# Patient Record
Sex: Male | Born: 1990 | Race: White | Hispanic: No | Marital: Married | Smoking: Former smoker
Health system: Southern US, Community
[De-identification: ages and names within clinical notes are randomized; demographics above are authoritative.]

## PROBLEM LIST (undated history)

## (undated) DIAGNOSIS — IMO0001 Reserved for inherently not codable concepts without codable children: Secondary | ICD-10-CM

## (undated) DIAGNOSIS — F909 Attention-deficit hyperactivity disorder, unspecified type: Secondary | ICD-10-CM

## (undated) DIAGNOSIS — U071 COVID-19: Secondary | ICD-10-CM

## (undated) DIAGNOSIS — H7290 Unspecified perforation of tympanic membrane, unspecified ear: Secondary | ICD-10-CM

## (undated) DIAGNOSIS — K219 Gastro-esophageal reflux disease without esophagitis: Secondary | ICD-10-CM

## (undated) DIAGNOSIS — B356 Tinea cruris: Secondary | ICD-10-CM

## (undated) DIAGNOSIS — F411 Generalized anxiety disorder: Secondary | ICD-10-CM

## (undated) DIAGNOSIS — G4700 Insomnia, unspecified: Secondary | ICD-10-CM

## (undated) DIAGNOSIS — R197 Diarrhea, unspecified: Secondary | ICD-10-CM

## (undated) HISTORY — DX: COVID-19: U07.1

## (undated) HISTORY — DX: Generalized anxiety disorder: F41.1

## (undated) HISTORY — DX: Gastro-esophageal reflux disease without esophagitis: K21.9

## (undated) HISTORY — PX: WISDOM TOOTH EXTRACTION: SHX21

## (undated) HISTORY — DX: Attention-deficit hyperactivity disorder, unspecified type: F90.9

## (undated) HISTORY — DX: Tinea cruris: B35.6

## (undated) HISTORY — PX: MYRINGOPLASTY: SUR873

## (undated) HISTORY — DX: Insomnia, unspecified: G47.00

## (undated) HISTORY — DX: Diarrhea, unspecified: R19.7

## (undated) HISTORY — PX: TONSILLECTOMY: SUR1361

---

## 2011-10-26 DIAGNOSIS — H7290 Unspecified perforation of tympanic membrane, unspecified ear: Secondary | ICD-10-CM

## 2011-10-26 HISTORY — DX: Unspecified perforation of tympanic membrane, unspecified ear: H72.90

## 2011-11-07 ENCOUNTER — Encounter (HOSPITAL_BASED_OUTPATIENT_CLINIC_OR_DEPARTMENT_OTHER): Payer: Self-pay | Admitting: *Deleted

## 2011-11-07 DIAGNOSIS — IMO0001 Reserved for inherently not codable concepts without codable children: Secondary | ICD-10-CM

## 2011-11-07 HISTORY — DX: Reserved for inherently not codable concepts without codable children: IMO0001

## 2011-11-10 NOTE — H&P (Signed)
PREOPERATIVE H&P  Chief Complaint: decreased hearing  HPI: Louis Vang is a 21 y.o. male who presents for evaluation of hearing and ear complaints. 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 He has had previous right tympanoplasty. On exam he has a large L TM perforation of about 25% and a small R TM perforation. He's taken to the OR for repair of the perfs.   Past Medical History  Diagnosis Date  . 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 Healing laceration 11/07/2011    web space right hand   Past Surgical History  Procedure Date  . Myringoplasty age 14  . Tonsillectomy   . Wisdom tooth extraction    History   Social History  . Marital Status: Unknown    Spouse Name: N/A    Number of Children: N/A  . Years of Education: N/A   Social History Main Topics  . Smoking status: Current Some Day Smoker -- 2 years    Types: Cigarettes  . Smokeless tobacco: Never Used   Comment: 5 cig./week  . Alcohol Use: Yes     2-3 x/week  . Drug Use: No  . Sexually Active:    Other Topics Concern  . None   Social History Narrative  . None   History reviewed. No pertinent family history. No Known Allergies Prior to Admission medications   Medication Sig Start Date End Date Taking? Authorizing Provider  fish oil-omega-3 fatty acids 1000 MG capsule Take 2 g by mouth daily.   Yes Historical Provider, MD  Multiple Vitamin (MULTIVITAMIN) tablet Take 1 tablet by mouth daily.   Yes Historical Provider, MD     Positive ROS: decreased hearing  All other systems have been reviewed and were otherwise negative with the exception of those mentioned in the HPI and as above.  Physical Exam: There were no vitals filed for this visit.  General: Alert, no acute distress Oral: Normal oral mucosa and tonsils Nasal: Clear nasal passages Neck: No palpable adenopathy  or thyroid nodules Ear: Ear canal is clear with 25 % anterior central L TM perforation and small ant superior R TM perforation Cardiovascular: Regular rate and rhythm, no murmur.  Respiratory: Clear to auscultation Neurologic: Alert and oriented x 3   Assessment/Plan: TM perforations Plan for Procedure(s): TYMPANOPLASTY left MYRINGOPLASTY WITH FAT GRAFT right   Dillard Cannon, MD 11/10/2011 10:21 PM

## 2011-11-11 ENCOUNTER — Encounter (HOSPITAL_BASED_OUTPATIENT_CLINIC_OR_DEPARTMENT_OTHER)
Admission: RE | Disposition: A | Discharge status: Home / Self Care | Payer: Self-pay | Source: Ambulatory Visit | Attending: Otolaryngology

## 2011-11-11 ENCOUNTER — Encounter (HOSPITAL_BASED_OUTPATIENT_CLINIC_OR_DEPARTMENT_OTHER): Payer: Self-pay | Admitting: Anesthesiology

## 2011-11-11 ENCOUNTER — Ambulatory Visit (HOSPITAL_BASED_OUTPATIENT_CLINIC_OR_DEPARTMENT_OTHER)
Admission: RE | Admit: 2011-11-11 | Discharge: 2011-11-11 | Disposition: A | Discharge status: Home / Self Care | Payer: Managed Care, Other (non HMO) | Source: Ambulatory Visit | Attending: Otolaryngology | Admitting: Otolaryngology

## 2011-11-11 ENCOUNTER — Encounter (HOSPITAL_BASED_OUTPATIENT_CLINIC_OR_DEPARTMENT_OTHER): Payer: Self-pay

## 2011-11-11 ENCOUNTER — Ambulatory Visit (HOSPITAL_BASED_OUTPATIENT_CLINIC_OR_DEPARTMENT_OTHER): Payer: Managed Care, Other (non HMO) | Admitting: Anesthesiology

## 2011-11-11 DIAGNOSIS — H7290 Unspecified perforation of tympanic membrane, unspecified ear: Secondary | ICD-10-CM | POA: Insufficient documentation

## 2011-11-11 HISTORY — PX: TYMPANOPLASTY: SHX33

## 2011-11-11 HISTORY — PX: MYRINGOPLASTY W/ PAPER PATCH: SHX2059

## 2011-11-11 HISTORY — DX: Reserved for inherently not codable concepts without codable children: IMO0001

## 2011-11-11 HISTORY — DX: Unspecified perforation of tympanic membrane, unspecified ear: H72.90

## 2011-11-11 LAB — POCT HEMOGLOBIN-HEMACUE: Hemoglobin: 12.9 g/dL — ABNORMAL LOW (ref 13.0–17.0)

## 2011-11-11 SURGERY — TYMPANOPLASTY
Anesthesia: General | Site: Ear | Laterality: Right | Wound class: Clean

## 2011-11-11 MED ORDER — PHENYLEPHRINE HCL 10 MG/ML IJ SOLN
10.0000 mg | INTRAVENOUS | Status: DC | PRN
  Administered 2011-11-11 (×2): 40 ug/min via INTRAVENOUS

## 2011-11-11 MED ORDER — EPINEPHRINE HCL 1 MG/ML IJ SOLN
INTRAMUSCULAR | Status: DC | PRN
  Administered 2011-11-11: 1 mg

## 2011-11-11 MED ORDER — SCOPOLAMINE 1 MG/3DAYS TD PT72
1.0000 | MEDICATED_PATCH | TRANSDERMAL | Status: DC
  Administered 2011-11-11: 1.5 mg via TRANSDERMAL

## 2011-11-11 MED ORDER — MIDAZOLAM HCL 2 MG/2ML IJ SOLN: 0.5000 mg | Freq: Once | INTRAMUSCULAR | Status: DC | PRN

## 2011-11-11 MED ORDER — MEPERIDINE HCL 25 MG/ML IJ SOLN: 6.2500 mg | INTRAMUSCULAR | Status: DC | PRN

## 2011-11-11 MED ORDER — HYDROMORPHONE HCL PF 1 MG/ML IJ SOLN
0.2500 mg | INTRAMUSCULAR | Status: DC | PRN
  Administered 2011-11-11: 0.25 mg via INTRAVENOUS

## 2011-11-11 MED ORDER — PROPOFOL 10 MG/ML IV EMUL
INTRAVENOUS | Status: DC | PRN
  Administered 2011-11-11: 300 mg via INTRAVENOUS

## 2011-11-11 MED ORDER — DEXAMETHASONE SODIUM PHOSPHATE 4 MG/ML IJ SOLN
INTRAMUSCULAR | Status: DC | PRN
  Administered 2011-11-11: 10 mg via INTRAVENOUS

## 2011-11-11 MED ORDER — CIPROFLOXACIN-DEXAMETHASONE 0.3-0.1 % OT SUSP
OTIC | Status: DC | PRN
  Administered 2011-11-11: 4 [drp] via OTIC

## 2011-11-11 MED ORDER — LIDOCAINE-EPINEPHRINE 1 %-1:100000 IJ SOLN
INTRAMUSCULAR | Status: DC | PRN
  Administered 2011-11-11: 7 mL

## 2011-11-11 MED ORDER — HYDROCODONE-ACETAMINOPHEN 5-500 MG PO TABS: 1.0000 | ORAL_TABLET | Freq: Four times a day (QID) | ORAL | Status: AC | PRN

## 2011-11-11 MED ORDER — LACTATED RINGERS IV SOLN
INTRAVENOUS | Status: DC
  Administered 2011-11-11 (×3): via INTRAVENOUS

## 2011-11-11 MED ORDER — SUCCINYLCHOLINE CHLORIDE 20 MG/ML IJ SOLN
INTRAMUSCULAR | Status: DC | PRN
  Administered 2011-11-11: 100 mg via INTRAVENOUS

## 2011-11-11 MED ORDER — FENTANYL CITRATE 0.05 MG/ML IJ SOLN
INTRAMUSCULAR | Status: DC | PRN
  Administered 2011-11-11 (×2): 50 ug via INTRAVENOUS
  Administered 2011-11-11: 100 ug via INTRAVENOUS

## 2011-11-11 MED ORDER — ONDANSETRON HCL 4 MG/2ML IJ SOLN
INTRAMUSCULAR | Status: DC | PRN
  Administered 2011-11-11 (×2): 4 mg via INTRAVENOUS

## 2011-11-11 MED ORDER — CEPHALEXIN 500 MG PO CAPS: 500.0000 mg | ORAL_CAPSULE | Freq: Two times a day (BID) | ORAL | Status: AC

## 2011-11-11 MED ORDER — HYDROCODONE-ACETAMINOPHEN 5-325 MG PO TABS
1.0000 | ORAL_TABLET | Freq: Once | ORAL | Status: AC
  Administered 2011-11-11: 1 via ORAL

## 2011-11-11 MED ORDER — PROMETHAZINE HCL 25 MG/ML IJ SOLN
6.2500 mg | INTRAMUSCULAR | Status: DC | PRN
  Administered 2011-11-11: 6.25 mg via INTRAVENOUS

## 2011-11-11 MED ORDER — BACITRACIN ZINC 500 UNIT/GM EX OINT
TOPICAL_OINTMENT | CUTANEOUS | Status: DC | PRN
  Administered 2011-11-11: 1 via TOPICAL

## 2011-11-11 MED ORDER — LIDOCAINE HCL (CARDIAC) 20 MG/ML IV SOLN
INTRAVENOUS | Status: DC | PRN
  Administered 2011-11-11: 50 mg via INTRAVENOUS

## 2011-11-11 MED ORDER — METHYLENE BLUE 1 % INJ SOLN
INTRAMUSCULAR | Status: DC | PRN
  Administered 2011-11-11: .5 mL

## 2011-11-11 MED ORDER — MIDAZOLAM HCL 5 MG/5ML IJ SOLN
INTRAMUSCULAR | Status: DC | PRN
  Administered 2011-11-11: 1 mg via INTRAVENOUS

## 2011-11-11 MED ORDER — PHENYLEPHRINE HCL 10 MG/ML IJ SOLN
INTRAMUSCULAR | Status: DC | PRN
  Administered 2011-11-11 (×2): 40 ug via INTRAVENOUS

## 2011-11-11 SURGICAL SUPPLY — 74 items
BENZOIN TINCTURE PRP APPL 2/3 (GAUZE/BANDAGES/DRESSINGS) ×3 IMPLANT
BIT DRILL LEGEND 0.5MM 70MM (BIT) IMPLANT
BIT DRILL LEGEND 1.0MM 70MM (BIT) IMPLANT
BIT DRILL LEGEND 4.0MM 70MM (BIT) IMPLANT
BLADE EAR TYMPAN 2.5 60D BEAV (BLADE) ×3 IMPLANT
BLADE EYE SICKLE 84 5 BEAV (BLADE) IMPLANT
BLADE NEEDLE 3 SS STRL (BLADE) IMPLANT
BLADE SURG ROTATE 9660 (MISCELLANEOUS) IMPLANT
CANISTER SUCTION 1200CC (MISCELLANEOUS) ×3 IMPLANT
CLOTH BEACON ORANGE TIMEOUT ST (SAFETY) ×3 IMPLANT
COTTONBALL LRG STERILE PKG (GAUZE/BANDAGES/DRESSINGS) ×3 IMPLANT
DECANTER SPIKE VIAL GLASS SM (MISCELLANEOUS) IMPLANT
DEPRESSOR TONGUE BLADE STERILE (MISCELLANEOUS) ×6 IMPLANT
DERMABOND ADVANCED (GAUZE/BANDAGES/DRESSINGS)
DERMABOND ADVANCED .7 DNX12 (GAUZE/BANDAGES/DRESSINGS) IMPLANT
DRAPE EENT ADH APERT 31X51 STR (DRAPES) ×3 IMPLANT
DRAPE MICROSCOPE URBAN (DRAPES) ×3 IMPLANT
DRAPE SURG 17X23 STRL (DRAPES) IMPLANT
DRESSING ADAPTIC 1/2  N-ADH (PACKING) IMPLANT
DRILL BIT LEGEND (BIT) IMPLANT
DRILL BIT LEGEND 7BA20-MN (BIT) IMPLANT
DRILL BIT LEGEND 7BA25-MN (BIT) IMPLANT
DRILL BIT LEGEND 7BA30-MN (BIT) IMPLANT
DRILL BIT LEGEND 7BA30D-MN (BIT) IMPLANT
DRILL BIT LEGEND 7BA30DL-MN (BIT) IMPLANT
DRILL BIT LEGEND 7BA30L-MN (BIT) IMPLANT
DRILL BIT LEGEND 7BA40-MN (BIT) IMPLANT
DRILL BIT LEGEND 7BA40D-MN (BIT) IMPLANT
DRILL BIT LEGEND 7BA50-MN (BIT) IMPLANT
DRILL BIT LEGEND 7BA50D-MN (BIT) IMPLANT
DRILL BIT LEGEND 7BA60-MN (BIT) IMPLANT
DRILL BIT LEGEND 7BA70-MN (BIT) IMPLANT
DRSG GLASSCOCK MASTOID ADT (GAUZE/BANDAGES/DRESSINGS) ×3 IMPLANT
DRSG GLASSCOCK MASTOID PED (GAUZE/BANDAGES/DRESSINGS) IMPLANT
ELECT COATED BLADE 2.86 ST (ELECTRODE) ×3 IMPLANT
ELECT REM PT RETURN 9FT ADLT (ELECTROSURGICAL) ×3
ELECTRODE REM PT RTRN 9FT ADLT (ELECTROSURGICAL) ×2 IMPLANT
GAUZE SPONGE 4X4 12PLY STRL LF (GAUZE/BANDAGES/DRESSINGS) IMPLANT
GLOVE SKINSENSE NS SZ7.0 (GLOVE) ×1
GLOVE SKINSENSE STRL SZ7.0 (GLOVE) ×2 IMPLANT
GLOVE SS BIOGEL STRL SZ 7.5 (GLOVE) ×2 IMPLANT
GLOVE SUPERSENSE BIOGEL SZ 7.5 (GLOVE) ×1
GOWN PREVENTION PLUS XLARGE (GOWN DISPOSABLE) ×6 IMPLANT
GOWN PREVENTION PLUS XXLARGE (GOWN DISPOSABLE) IMPLANT
IV CATH AUTO 14GX1.75 SAFE ORG (IV SOLUTION) IMPLANT
IV NS 500ML (IV SOLUTION)
IV NS 500ML BAXH (IV SOLUTION) IMPLANT
NDL SAFETY ECLIPSE 18X1.5 (NEEDLE) IMPLANT
NEEDLE 27GAX1X1/2 (NEEDLE) ×3 IMPLANT
NEEDLE HYPO 18GX1.5 SHARP (NEEDLE)
NS IRRIG 1000ML POUR BTL (IV SOLUTION) ×3 IMPLANT
PACK BASIN DAY SURGERY FS (CUSTOM PROCEDURE TRAY) ×3 IMPLANT
PACK ENT DAY SURGERY (CUSTOM PROCEDURE TRAY) ×3 IMPLANT
PENCIL BUTTON HOLSTER BLD 10FT (ELECTRODE) ×3 IMPLANT
SET EXT MALE ROTATING LL 32IN (MISCELLANEOUS) ×3 IMPLANT
SHEET MEDIUM DRAPE 40X70 STRL (DRAPES) IMPLANT
SLEEVE SCD COMPRESS KNEE MED (MISCELLANEOUS) ×3 IMPLANT
SPONGE GAUZE 4X4 12PLY (GAUZE/BANDAGES/DRESSINGS) IMPLANT
SPONGE SURGIFOAM ABS GEL 12-7 (HEMOSTASIS) ×6 IMPLANT
STRIP CLOSURE SKIN 1/2X4 (GAUZE/BANDAGES/DRESSINGS) IMPLANT
SUT CHROMIC 2 0 SH (SUTURE) IMPLANT
SUT CHROMIC 3 0 PS 2 (SUTURE) ×3 IMPLANT
SUT ETHILON 4 0 P 3 18 (SUTURE) IMPLANT
SUT ETHILON 5 0 P 3 18 (SUTURE) ×1
SUT NYLON ETHILON 5-0 P-3 1X18 (SUTURE) ×2 IMPLANT
SUT PLAIN 5 0 P 3 18 (SUTURE) IMPLANT
SYR 3ML 18GX1 1/2 (SYRINGE) IMPLANT
SYR 5ML LL (SYRINGE) IMPLANT
SYR TB 1ML LL NO SAFETY (SYRINGE) IMPLANT
TOWEL OR 17X24 6PK STRL BLUE (TOWEL DISPOSABLE) ×6 IMPLANT
TRAY DSU PREP LF (CUSTOM PROCEDURE TRAY) ×3 IMPLANT
TUBE CONNECTING 20X1/4 (TUBING) ×3 IMPLANT
TUBING IRRIGATION STER IRD100 (TUBING) IMPLANT
WATER STERILE IRR 1000ML POUR (IV SOLUTION) IMPLANT

## 2011-11-11 NOTE — Brief Op Note (Signed)
11/11/2011  11:11 AM  PATIENT:  Louis Vang  21 y.o. male  PRE-OPERATIVE DIAGNOSIS:bilateral  tympanic membrane perforations  POST-OPERATIVE DIAGNOSIS:  tympanic membrane perforations  PROCEDURE:  Procedure(s) (LRB): TYMPANOPLASTY (Left) MYRINGOPLASTY WITH FAT GRAFT (Right)  SURGEON:  Surgeon(s) and Role:    * Drema Halon, MD - Primary  PHYSICIAN ASSISTANT:   ASSISTANTS: none   ANESTHESIA:   general  EBL:  Total I/O In: 2000 [I.V.:2000] Out: -   BLOOD ADMINISTERED:none  DRAINS: none   LOCAL MEDICATIONS USED:  NONE  SPECIMEN:  No Specimen  DISPOSITION OF SPECIMEN:  N/A  COUNTS:  YES  TOURNIQUET:  * No tourniquets in log *  DICTATION: .Other Dictation: Dictation Number [id number]  PLAN OF CARE: Discharge to home after PACU  PATIENT DISPOSITION:  PACU - hemodynamically stable.   Delay start of Pharmacological VTE agent (>24hrs) due to surgical blood loss or risk of bleeding: yes

## 2011-11-11 NOTE — Op Note (Signed)
Louis Vang, Louis Vang NO.:  [id number]  MEDICAL RECORD NO.:  [id number]  LOCATION:                                 FACILITY:  PHYSICIAN:  Kristine Garbe. Ezzard Standing, M.D. DATE OF BIRTH:  DATE OF PROCEDURE:  11/11/2011 DATE OF DISCHARGE:                              OPERATIVE REPORT   PREOPERATIVE DIAGNOSIS:  Bilateral tympanic membrane perforations with 30% anterior left tympanic membrane perforation and a small less than 10% anterior superior right tympanic membrane  perforation.  POSTOPERATIVE DIAGNOSIS:  Bilateral tympanic membrane perforations with 30% anterior left tympanic membrane perforation and a small less than 10% anterior superior right tympanic membrane perforation.  OPERATION PERFORMED:  Left medial graft tympanoplasty.  Right myringoplasty with fat graft.  SURGEON:  Kristine Garbe. Ezzard Standing, M.D.  ANESTHESIA:  General endotracheal.  COMPLICATIONS:  None.  BRIEF CLINICAL NOTE:  Louis Vang is a 21 year old gentleman, who has had some hearing and ear complaints.  He has had a couple sets of tubes as a child, and following extrusion of the tubes, he was left with bilateral tympanic membrane perforations.  He is status post a previous right tympanoplasty.  He has underwent a hearing test which shows hearing to be approximately 20-25 dB with a small air bone gap of 5-10 dB.  On exam in the office, he has a moderate anterior right and left TM perforation of about 30% and a very small anterior and superior right TM residual perforation status post right tympanoplasty.  He was taken to operating room this time for left tympanoplasty, and at the same time, we will plan on applying a fat graft myringoplasty to the right ear to see if we can resolve the small residual right TM perforation.  DESCRIPTION OF PROCEDURE:  After adequate endotracheal anesthesia, ear canals were examined.  He had moderately small ear canals bilaterally. The perforation on the left  TM was anterior and extended up superiorly, was a central perforation approximately 25-30% of the TM.  The ear canal had been prepped with Betadine solution and draped out with sterile towels.  The ear canal was then injected with Xylocaine with epinephrine for hemostasis.  The edges of the perforation on the left side were freshened up with a pick and cup forceps.  Following this, a temporalis fascial graft was harvested from a small postauricular incision and prepped and later decided to dry.  During this same time, obtained some fat for later use for myringoplasty on the right ear in fact this was saline.  A posterior based tympanomeatal flap was then elevated through the ear canal.  At the level of the anulus, cotton pledgets soaked in adrenaline were placed for hemostasis.  These were then removed and the anulus was elevated and middle ear space was entered.  The incus and stapes were intact and mobile.  After elevating the tympanomeatal flap, the fascia graft was cut to appropriate size and placed medial to the tympanomeatal flap and was positioned so as to cover the entire perforation, especially anterior superiorly.  The middle ear space was packed with Gelfoam soaked in Ciprodex.  The tympanomeatal flap was brought back down the fascial graft to cover  the entire perforation. The ear canal was then packed with Gelfoam and soaked in Ciprodex, and a mastoid dressing was applied on the left ear.  This completed the left TM tympanoplasty.  Next, the right ear was examined.  The patient had some tympanosclerosis involving the TM and had a small residual perforation anteriorly and superiorly which measured 2-3 mm in size.  A fat graft was cut to appropriate size and placed after freshening up the edges of the perforation, the fat graft was placed through the perforation anterior superiorly and then the ear canal was packed with two disk of Gelfoam soaked in Ciprodex.  This completed  the myringoplasty.  Louis Vang was awoke from anesthesia, and transferred to recovery room postoperatively, doing well.  DISPOSITION:  Louis Vang is discharged home later this morning on Keflex 500 mg b.i.d. for 5 days, Tylenol and Vicodin p.r.n. pain.  We will have him followup in my office in 1 week for recheck and removed the left postauricular sutures from the fascial graft donor site.  He was instructed to keep water out of ears.          ______________________________ Kristine Garbe Ezzard Standing, M.D.     CEN/MEDQ  D:  11/11/2011  T:  11/11/2011  Job:  [id number]  cc:   Antony Madura, M.D.

## 2011-11-11 NOTE — Discharge Instructions (Addendum)
Remove the dressing from the L ear in AM and change cotton ball in the ear. OK to wash behind the ear but keep water out of both ear canals. Call the office for follow up in 1 week   [phone]   Post Anesthesia Home Care Instructions  Activity: Get plenty of rest for the remainder of the day. A responsible adult should stay with you for 24 hours following the procedure.  For the next 24 hours, DO NOT: -Drive a car -Advertising copywriter -Drink alcoholic beverages -Take any medication unless instructed by your physician -Make any legal decisions or sign important papers.  Meals: Start with liquid foods such as gelatin or soup. Progress to regular foods as tolerated. Avoid greasy, spicy, heavy foods. If nausea and/or vomiting occur, drink only clear liquids until the nausea and/or vomiting subsides. Call your physician if vomiting continues.  Special Instructions/Symptoms: Your throat may feel dry or sore from the anesthesia or the breathing tube placed in your throat during surgery. If this causes discomfort, gargle with warm salt water. The discomfort should disappear within 24 hours.

## 2011-11-11 NOTE — Transfer of Care (Signed)
Immediate Anesthesia Transfer of Care Note  Patient: Louis Vang  Procedure(s) Performed: Procedure(s) (LRB): TYMPANOPLASTY (Left) MYRINGOPLASTY WITH CIGARETTE PAPER (Right)  Patient Location: PACU  Anesthesia Type: General  Level of Consciousness: awake  Airway & Oxygen Therapy: Patient Spontanous Breathing and Patient connected to face mask oxygen  Post-op Assessment: Report given to PACU RN and Post -op Vital signs reviewed and stable  Post vital signs: Reviewed and stable  Complications: No apparent anesthesia complications

## 2011-11-11 NOTE — Anesthesia Procedure Notes (Signed)
Procedure Name: Intubation Date/Time: 11/11/2011 8:50 AM Performed by: Zenia Resides D Pre-anesthesia Checklist: Patient identified, Emergency Drugs available, Suction available, Patient being monitored and Timeout performed Patient Re-evaluated:Patient Re-evaluated prior to inductionOxygen Delivery Method: Circle System Utilized Preoxygenation: Pre-oxygenation with 100% oxygen Intubation Type: IV induction Ventilation: Mask ventilation without difficulty Laryngoscope Size: Mac and 3 Grade View: Grade I Tube type: Oral Tube size: 8.0 mm Number of attempts: 1 Airway Equipment and Method: stylet and oral airway Placement Confirmation: ETT inserted through vocal cords under direct vision,  positive ETCO2 and breath sounds checked- equal and bilateral Secured at: 23 cm Tube secured with: Tape Dental Injury: Teeth and Oropharynx as per pre-operative assessment

## 2011-11-11 NOTE — Interval H&P Note (Signed)
History and Physical Interval Note:  11/11/2011 7:33 AM  Molly Maduro "Louis Vang" Capote  has presented today for surgery, with the diagnosis of TM perforations  The various methods of treatment have been discussed with the patient and family. After consideration of risks, benefits and other options for treatment, the patient has consented to  Procedure(s) (LRB): TYMPANOPLASTY (Left) MYRINGOPLASTY WITH CIGARETTE PAPER (Right) as a surgical intervention .  The patients' history has been reviewed, patient examined, no change in status, stable for surgery.  I have reviewed the patients' chart and labs.  Questions were answered to the patient's satisfaction.     Nero Sawatzky

## 2011-11-11 NOTE — Anesthesia Postprocedure Evaluation (Signed)
  Anesthesia Post-op Note  Patient: Louis Vang  Procedure(s) Performed: Procedure(s) (LRB): TYMPANOPLASTY (Left) MYRINGOPLASTY WITH CIGARETTE PAPER (Right)  Patient Location: PACU  Anesthesia Type: General  Level of Consciousness: awake, alert  and oriented  Airway and Oxygen Therapy: Patient Spontanous Breathing  Post-op Pain: none  Post-op Assessment: Post-op Vital signs reviewed, Patient's Cardiovascular Status Stable, Respiratory Function Stable, Patent Airway, No signs of Nausea or vomiting and Pain level controlled  Post-op Vital Signs: Reviewed and stable  Complications: No apparent anesthesia complications

## 2011-11-11 NOTE — Anesthesia Preprocedure Evaluation (Signed)
Anesthesia Evaluation  Patient identified by MRN, date of birth, ID band Patient awake    Reviewed: Allergy & Precautions, H&P , NPO status , Patient's Chart, lab work & pertinent test results  History of Anesthesia Complications (+) PONV  Airway Mallampati: I TM Distance: >3 FB Neck ROM: Full    Dental No notable dental hx. (+) Teeth Intact and Dental Advisory Given   Pulmonary neg pulmonary ROS,  breath sounds clear to auscultation  Pulmonary exam normal       Cardiovascular negative cardio ROS  Rhythm:Regular Rate:Normal     Neuro/Psych negative neurological ROS     GI/Hepatic negative GI ROS, Neg liver ROS,   Endo/Other  negative endocrine ROS  Renal/GU negative Renal ROS     Musculoskeletal   Abdominal   Peds  Hematology negative hematology ROS (+)   Anesthesia Other Findings   Reproductive/Obstetrics                           Anesthesia Physical Anesthesia Plan  ASA: I  Anesthesia Plan: General   Post-op Pain Management:    Induction: Intravenous  Airway Management Planned: Oral ETT  Additional Equipment:   Intra-op Plan:   Post-operative Plan: Extubation in OR  Informed Consent: I have reviewed the patients History and Physical, chart, labs and discussed the procedure including the risks, benefits and alternatives for the proposed anesthesia with the patient or authorized representative who has indicated his/her understanding and acceptance.   Dental advisory given  Plan Discussed with: CRNA and Surgeon  Anesthesia Plan Comments: (Plan routine monitors, GETA)        Anesthesia Quick Evaluation

## 2011-11-14 ENCOUNTER — Encounter (HOSPITAL_BASED_OUTPATIENT_CLINIC_OR_DEPARTMENT_OTHER): Payer: Self-pay | Admitting: Otolaryngology

## 2013-08-05 ENCOUNTER — Encounter (HOSPITAL_COMMUNITY): Payer: Self-pay | Admitting: Emergency Medicine

## 2013-08-05 ENCOUNTER — Emergency Department (HOSPITAL_COMMUNITY)
Admission: EM | Admit: 2013-08-05 | Discharge: 2013-08-05 | Disposition: A | Discharge status: Home / Self Care | Payer: Managed Care, Other (non HMO) | Attending: Emergency Medicine | Admitting: Emergency Medicine

## 2013-08-05 DIAGNOSIS — F17200 Nicotine dependence, unspecified, uncomplicated: Secondary | ICD-10-CM | POA: Insufficient documentation

## 2013-08-05 DIAGNOSIS — M545 Low back pain, unspecified: Secondary | ICD-10-CM | POA: Insufficient documentation

## 2013-08-05 DIAGNOSIS — B9789 Other viral agents as the cause of diseases classified elsewhere: Secondary | ICD-10-CM | POA: Insufficient documentation

## 2013-08-05 DIAGNOSIS — R000 Tachycardia, unspecified: Secondary | ICD-10-CM | POA: Insufficient documentation

## 2013-08-05 DIAGNOSIS — K529 Noninfective gastroenteritis and colitis, unspecified: Secondary | ICD-10-CM

## 2013-08-05 DIAGNOSIS — Z8669 Personal history of other diseases of the nervous system and sense organs: Secondary | ICD-10-CM | POA: Insufficient documentation

## 2013-08-05 DIAGNOSIS — K5289 Other specified noninfective gastroenteritis and colitis: Secondary | ICD-10-CM | POA: Insufficient documentation

## 2013-08-05 DIAGNOSIS — B349 Viral infection, unspecified: Secondary | ICD-10-CM

## 2013-08-05 DIAGNOSIS — G8929 Other chronic pain: Secondary | ICD-10-CM | POA: Insufficient documentation

## 2013-08-05 DIAGNOSIS — R231 Pallor: Secondary | ICD-10-CM | POA: Insufficient documentation

## 2013-08-05 DIAGNOSIS — Z87828 Personal history of other (healed) physical injury and trauma: Secondary | ICD-10-CM | POA: Insufficient documentation

## 2013-08-05 DIAGNOSIS — Z79899 Other long term (current) drug therapy: Secondary | ICD-10-CM | POA: Insufficient documentation

## 2013-08-05 LAB — URINALYSIS, ROUTINE W REFLEX MICROSCOPIC
BILIRUBIN URINE: NEGATIVE
Glucose, UA: NEGATIVE mg/dL
HGB URINE DIPSTICK: NEGATIVE
KETONES UR: NEGATIVE mg/dL
Leukocytes, UA: NEGATIVE
Nitrite: NEGATIVE
Protein, ur: NEGATIVE mg/dL
SPECIFIC GRAVITY, URINE: 1.024 (ref 1.005–1.030)
UROBILINOGEN UA: 0.2 mg/dL (ref 0.0–1.0)
pH: 5.5 (ref 5.0–8.0)

## 2013-08-05 LAB — POCT I-STAT, CHEM 8
BUN: 23 mg/dL (ref 6–23)
CALCIUM ION: 1.22 mmol/L (ref 1.12–1.23)
CHLORIDE: 106 meq/L (ref 96–112)
CREATININE: 1 mg/dL (ref 0.50–1.35)
GLUCOSE: 106 mg/dL — AB (ref 70–99)
HCT: 39 % (ref 39.0–52.0)
HEMOGLOBIN: 13.3 g/dL (ref 13.0–17.0)
POTASSIUM: 4.2 meq/L (ref 3.7–5.3)
Sodium: 140 mEq/L (ref 137–147)
TCO2: 21 mmol/L (ref 0–100)

## 2013-08-05 MED ORDER — KETOROLAC TROMETHAMINE 30 MG/ML IJ SOLN
30.0000 mg | Freq: Once | INTRAMUSCULAR | Status: AC
  Administered 2013-08-05: 30 mg via INTRAVENOUS
  Filled 2013-08-05: qty 1

## 2013-08-05 MED ORDER — ONDANSETRON HCL 4 MG/2ML IJ SOLN
4.0000 mg | Freq: Once | INTRAMUSCULAR | Status: AC
  Administered 2013-08-05: 4 mg via INTRAVENOUS
  Filled 2013-08-05: qty 2

## 2013-08-05 MED ORDER — ONDANSETRON 8 MG PO TBDP: 8.0000 mg | ORAL_TABLET | Freq: Three times a day (TID) | ORAL | Status: DC | PRN

## 2013-08-05 NOTE — ED Provider Notes (Signed)
CSN: [id number]     Arrival date & time 08/05/13  0104 History   First MD Initiated Contact with Patient 08/05/13 0236     Chief Complaint  Patient presents with  . Emesis  . Chills  . Generalized Body Aches   (Consider location/radiation/quality/duration/timing/severity/associated sxs/prior Treatment) HPI 23 year old male presents to emergency room with complaint of nausea, vomiting, diarrhea, bodyaches, chills.  Symptoms started yesterday afternoon.  No known sick contacts, no unusual foods, no travel.  No blood in emesis or stool.  He reports mild chronic low back pain, and diffuse mild abdominal pain.  No urinary symptoms, no headache Past Medical History  Diagnosis Date  . 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 Healing laceration 11/07/2011    web space right hand   Past Surgical History  Procedure Laterality Date  . Myringoplasty  age 33  . Tonsillectomy    . Wisdom tooth extraction    . Tympanoplasty  11/11/2011    Procedure: TYMPANOPLASTY;  Surgeon: Drema Halon, MD;  Location: [facility];  Service: ENT;  Laterality: Left;  Marland Kitchen Myringoplasty w/ paper patch  11/11/2011    Procedure: MYRINGOPLASTY WITH CIGARETTE PAPER;  Surgeon: Drema Halon, MD;  Location: [facility];  Service: ENT;  Laterality: Right;   fat graft   History reviewed. No pertinent family history. History  Substance Use Topics  . Smoking status: Current Some Day Smoker -- 2 years    Types: Cigarettes  . Smokeless tobacco: Never Used     Comment: 5 cig./week  . Alcohol Use: Yes     Comment: 2-3 x/week    Review of Systems  See History of Present Illness; otherwise all other systems are reviewed and negative Allergies  Review of patient's allergies indicates no known allergies.  Home Medications   Current Outpatient Rx  Name  Route  Sig  Dispense  Refill  . amphetamine-dextroamphetamine (ADDERALL) 20 MG tablet   Oral   Take 10-20 mg by mouth 2  (two) times daily.         Marland Kitchen ibuprofen (ADVIL,MOTRIN) 200 MG tablet   Oral   Take 400 mg by mouth every 6 (six) hours as needed for headache or moderate pain.         . Multiple Vitamin (MULTIVITAMIN WITH MINERALS) TABS tablet   Oral   Take 1 tablet by mouth every morning.          BP 98/51  Pulse 106  Temp(Src) 98.1 F (36.7 C) (Oral)  Resp 18  SpO2 100% Physical Exam  Constitutional: He is oriented to person, place, and time. He appears well-developed and well-nourished. He appears distressed (patient appears uncomfortable, unwell).  HENT:  Head: Normocephalic and atraumatic.  Nose: Nose normal.  Mouth/Throat: Oropharynx is clear and moist.  Dry mucous membranes  Eyes: Conjunctivae and EOM are normal. Pupils are equal, round, and reactive to light.  Neck: Normal range of motion. Neck supple. No JVD present. No tracheal deviation present. No thyromegaly present.  Cardiovascular: Regular rhythm, normal heart sounds and intact distal pulses.  Exam reveals no gallop and no friction rub.   No murmur heard. Mild tachycardia noted  Pulmonary/Chest: Effort normal and breath sounds normal. No stridor. No respiratory distress. He has no wheezes. He has no rales. He exhibits no tenderness.  Abdominal: Soft. Bowel sounds are normal. He exhibits no distension and no mass. There is tenderness (mild diffuse tenderness). There is no rebound and no  guarding.  Musculoskeletal: Normal range of motion. He exhibits no edema and no tenderness.  Lymphadenopathy:    He has no cervical adenopathy.  Neurological: He is alert and oriented to person, place, and time. He exhibits normal muscle tone. Coordination normal.  Skin: Skin is warm and dry. No rash noted. No erythema. There is pallor.  Psychiatric: He has a normal mood and affect. His behavior is normal. Judgment and thought content normal.    ED Course  Procedures (including critical care time) Labs Review Labs Reviewed  POCT I-STAT,  CHEM 8 - Abnormal; Notable for the following:    Glucose, Bld 106 (*)    All other components within normal limits  URINALYSIS, ROUTINE W REFLEX MICROSCOPIC   Imaging Review No results found.  EKG Interpretation   None       MDM   1. Gastroenteritis   2. Viral syndrome    23 year old male with gastroenteritis.  Will give fluids, Zofran.  We'll give dose of Toradol for back pain.    Olivia Mackielga M Zahara Rembert, MD 08/05/13 [phone]

## 2013-08-05 NOTE — ED Notes (Signed)
Pt reports he became nauseated with chills and generalized body aches yesterday afternoon. Pt states he has had x5 episodes of vomiting, and x2 episodes of diarrhea. Pt states he last ate at 1000 yesterday. Pt noted to be pale and lethargic in triage.

## 2013-08-05 NOTE — Discharge Instructions (Signed)
Viral Gastroenteritis Viral gastroenteritis is also known as stomach flu. This condition affects the stomach and intestinal tract. It can cause sudden diarrhea and vomiting. The illness typically lasts 3 to 8 days. Most people develop an immune response that eventually gets rid of the virus. While this natural response develops, the virus can make you quite ill. CAUSES  Many different viruses can cause gastroenteritis, such as rotavirus or noroviruses. You can catch one of these viruses by consuming contaminated food or water. You may also catch a virus by sharing utensils or other personal items with an infected person or by touching a contaminated surface. SYMPTOMS  The most common symptoms are diarrhea and vomiting. These problems can cause a severe loss of body fluids (dehydration) and a body salt (electrolyte) imbalance. Other symptoms may include:  Fever.  Headache.  Fatigue.  Abdominal pain. DIAGNOSIS  Your caregiver can usually diagnose viral gastroenteritis based on your symptoms and a physical exam. A stool sample may also be taken to test for the presence of viruses or other infections. TREATMENT  This illness typically goes away on its own. Treatments are aimed at rehydration. The most serious cases of viral gastroenteritis involve vomiting so severely that you are not able to keep fluids down. In these cases, fluids must be given through an intravenous line (IV). HOME CARE INSTRUCTIONS   Drink enough fluids to keep your urine clear or pale yellow. Drink small amounts of fluids frequently and increase the amounts as tolerated.  Ask your caregiver for specific rehydration instructions.  Avoid:  Foods high in sugar.  Alcohol.  Carbonated drinks.  Tobacco.  Juice.  Caffeine drinks.  Extremely hot or cold fluids.  Fatty, greasy foods.  Too much intake of anything at one time.  Dairy products until 24 to 48 hours after diarrhea stops.  You may consume probiotics.  Probiotics are active cultures of beneficial bacteria. They may lessen the amount and number of diarrheal stools in adults. Probiotics can be found in yogurt with active cultures and in supplements.  Wash your hands well to avoid spreading the virus.  Only take over-the-counter or prescription medicines for pain, discomfort, or fever as directed by your caregiver. Do not give aspirin to children. Antidiarrheal medicines are not recommended.  Ask your caregiver if you should continue to take your regular prescribed and over-the-counter medicines.  Keep all follow-up appointments as directed by your caregiver. SEEK IMMEDIATE MEDICAL CARE IF:   You are unable to keep fluids down.  You do not urinate at least once every 6 to 8 hours.  You develop shortness of breath.  You notice blood in your stool or vomit. This may look like coffee grounds.  You have abdominal pain that increases or is concentrated in one small area (localized).  You have persistent vomiting or diarrhea.  You have a fever.  The patient is a child younger than 3 months, and he or she has a fever.  The patient is a child older than 3 months, and he or she has a fever and persistent symptoms.  The patient is a child older than 3 months, and he or she has a fever and symptoms suddenly get worse.  The patient is a baby, and he or she has no tears when crying. MAKE SURE YOU:   Understand these instructions.  Will watch your condition.  Will get help right away if you are not doing well or get worse. Document Released: 06/13/2005 Document Revised: 09/05/2011 Document Reviewed: 03/30/2011   [employer] Patient Information 2014 [employer]  

## 2018-02-03 ENCOUNTER — Emergency Department
Admission: EM | Admit: 2018-02-03 | Discharge: 2018-02-03 | Disposition: A | Discharge status: Home / Self Care | Payer: BLUE CROSS/BLUE SHIELD | Source: Home / Self Care | Attending: Family Medicine | Admitting: Family Medicine

## 2018-02-03 ENCOUNTER — Encounter: Payer: Self-pay | Admitting: Emergency Medicine

## 2018-02-03 DIAGNOSIS — R519 Headache, unspecified: Secondary | ICD-10-CM

## 2018-02-03 DIAGNOSIS — R51 Headache: Secondary | ICD-10-CM | POA: Diagnosis not present

## 2018-02-03 DIAGNOSIS — R42 Dizziness and giddiness: Secondary | ICD-10-CM | POA: Diagnosis not present

## 2018-02-03 LAB — POCT FASTING CBG KUC MANUAL ENTRY: POCT Glucose (KUC): 75 mg/dL (ref 70–99)

## 2018-02-03 MED ORDER — MECLIZINE HCL 25 MG PO TABS: 25.0000 mg | ORAL_TABLET | Freq: Three times a day (TID) | ORAL | 0 refills | Status: DC | PRN

## 2018-02-03 NOTE — ED Provider Notes (Signed)
[facility]    CSN: [id number] Arrival date & time: 02/03/18  1642     History   Chief Complaint Chief Complaint  Patient presents with  . Dizziness    HPI Louis Vang is a 27 y.o. male.   HPI  Louis Vang is a 27 y.o. male presenting to UC with c/o intermittent dizziness that started last night. Mild room spinning. Improves with rest.  The dizziness started again while he was at work, associated cold sweats.  He does report a hx of ear issues and has small holes in his TMs due to ear tubes not taking well.  Last one was removed from Right ear several months ago. Denies ear pain or drainage. He does report several months to years of a daily mild headache. Temporary relief with OTC ibuprofen. Denies change in vision or nausea. He has been eating and drinking well. No personal hx of DM but family hx of diabetes and mother request he be tested.  He does have a PCP but does not recall being tested for diabetes.    Past Medical History:  Diagnosis Date  . Healing laceration 11/07/2011   web space right hand  . 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   Past Surgical History:  Procedure Laterality Date  . MYRINGOPLASTY  age 44  . MYRINGOPLASTY W/ PAPER PATCH  11/11/2011   Procedure: MYRINGOPLASTY WITH CIGARETTE PAPER;  Surgeon: Drema Halon, MD;  Location: [facility];  Service: ENT;  Laterality: Right;   fat graft  . TONSILLECTOMY    . TYMPANOPLASTY  11/11/2011   Procedure: TYMPANOPLASTY;  Surgeon: Drema Halon, MD;  Location: [facility];  Service: ENT;  Laterality: Left;  . WISDOM TOOTH EXTRACTION         Home Medications    Prior to Admission medications   Medication Sig Start Date End Date Taking? Authorizing Provider  lisdexamfetamine (VYVANSE) 30 MG capsule Take 30 mg by mouth daily.   Yes [provider]  ibuprofen (ADVIL,MOTRIN)  200 MG tablet Take 400 mg by mouth every 6 (six) hours as needed for headache or moderate pain.    [provider]  meclizine (ANTIVERT) 25 MG tablet Take 1 tablet (25 mg total) by mouth 3 (three) times daily as needed for dizziness. 02/03/18   Lurene Shadow, PA-C  Multiple Vitamin (MULTIVITAMIN WITH MINERALS) TABS tablet Take 1 tablet by mouth every morning.    [provider]  ondansetron (ZOFRAN ODT) 8 MG disintegrating tablet Take 1 tablet (8 mg total) by mouth every 8 (eight) hours as needed for nausea or vomiting. 08/05/13   Marisa Severin, MD    Family History No family history on file.  Social History Social History   Tobacco Use  . Smoking status: Current Some Day Smoker    Years: 2.00    Types: Cigarettes  . Smokeless tobacco: Never Used  . Tobacco comment: 5 cig./week  Substance Use Topics  . Alcohol use: Yes    Comment: 2-3 x/week  . Drug use: No     Allergies   Patient has no known allergies.   Review of Systems Review of Systems  HENT: Negative for ear discharge and ear pain.   Eyes: Negative for photophobia and visual disturbance.  Gastrointestinal: Negative for diarrhea, nausea and vomiting.  Neurological: Positive for dizziness and headaches. Negative for syncope, weakness and light-headedness.  Physical Exam Triage Vital Signs ED Triage Vitals [02/03/18 1706]  Enc Vitals Group     BP 113/76     Pulse Rate 81     Resp      Temp 98.5 F (36.9 C)     Temp Source Oral     SpO2 97 %     Weight 158 lb 12 oz (72 kg)     Height 5\' 9"  (1.753 m)     Head Circumference      Peak Flow      Pain Score 0     Pain Loc      Pain Edu?      Excl. in GC?    No data found.  Updated Vital Signs BP 113/76 (BP Location: Right Arm)   Pulse 81   Temp 98.5 F (36.9 C) (Oral)   Ht 5\' 9"  (1.753 m)   Wt 158 lb 12 oz (72 kg)   SpO2 97%   BMI 23.44 kg/m   Visual Acuity Right Eye Distance:   Left Eye Distance:   Bilateral Distance:     Right Eye Near:   Left Eye Near:    Bilateral Near:     Physical Exam  Constitutional: He is oriented to person, place, and time. He appears well-developed and well-nourished. No distress.  HENT:  Head: Normocephalic and atraumatic.  Right Ear: No swelling or tenderness. Tympanic membrane is perforated. Tympanic membrane is not erythematous. No middle ear effusion.  Left Ear: No swelling or tenderness. Tympanic membrane is perforated. Tympanic membrane is not erythematous.  No middle ear effusion.  Nose: Nose normal.  Mouth/Throat: Uvula is midline, oropharynx is clear and moist and mucous membranes are normal.  Eyes: Pupils are equal, round, and reactive to light. EOM are normal. Right eye exhibits no discharge. Left eye exhibits no discharge.  Neck: Normal range of motion. Neck supple.  Cardiovascular: Normal rate and regular rhythm.  Pulmonary/Chest: Effort normal and breath sounds normal. No stridor. No respiratory distress. He has no wheezes.  Musculoskeletal: Normal range of motion.  Neurological: He is alert and oriented to person, place, and time. No cranial nerve deficit.  CN II-XII in tact. Speech is clear. Alert to person, place and time. Normal finger to nose coordination. Normal gait.   Skin: Skin is warm and dry. Capillary refill takes less than 2 seconds. He is not diaphoretic.  Psychiatric: He has a normal mood and affect. His behavior is normal.  Nursing note and vitals reviewed.    UC Treatments / Results  Labs (all labs ordered are listed, but only abnormal results are displayed) Labs Reviewed  POCT FASTING [facility] MANUAL ENTRY    EKG None  Radiology No results found.  Procedures Procedures (including critical care time)  Medications Ordered in UC Medications - No data to display  Initial Impression / Assessment and Plan / UC Course  I have reviewed the triage vital signs and the nursing notes.  Pertinent labs & imaging results that were available  during my care of the patient were reviewed by me and considered in my medical decision making (see chart for details).     Normal neuro exam. Labs: unremarkable Will treat for possible vertigo with meclizine Home instructions provided.  Final Clinical Impressions(s) / UC Diagnoses   Final diagnoses:  Dizziness  Chronic daily headache     Discharge Instructions      Antivert (meclizine) is a medication to help with dizziness and nausea related to  vertigo.  This medication can cause drowsiness. Do not operate heavy machinery or drive while taking.   Please call to schedule a follow up appointment with your family doctor this week for further evaluation of dizziness, daily headaches and for screening of Diabetes, thyroid disease or other potential cause of daily headaches and intermittent dizziness.  Please call 911 or go to the hospital if symptoms worsening.     ED Prescriptions    Medication Sig Dispense Auth. Provider   meclizine (ANTIVERT) 25 MG tablet Take 1 tablet (25 mg total) by mouth 3 (three) times daily as needed for dizziness. 30 tablet Lurene Shadow, PA-C     Controlled Substance Prescriptions [place] Controlled Substance Registry consulted? Not Applicable   Rolla Plate 02/05/18 1213

## 2018-02-03 NOTE — Discharge Instructions (Signed)
°  Antivert (meclizine) is a medication to help with dizziness and nausea related to vertigo.  This medication can cause drowsiness. Do not operate heavy machinery or drive while taking.   Please call to schedule a follow up appointment with your family doctor this week for further evaluation of dizziness, daily headaches and for screening of Diabetes, thyroid disease or other potential cause of daily headaches and intermittent dizziness.  Please call 911 or go to the hospital if symptoms worsening.

## 2018-02-03 NOTE — ED Triage Notes (Signed)
Patient states that he started feeling dizzy last night, worse today while at work, cold sweats, does have a history of ear issues.

## 2018-05-17 ENCOUNTER — Encounter: Payer: Self-pay | Admitting: Emergency Medicine

## 2018-05-17 ENCOUNTER — Emergency Department (INDEPENDENT_AMBULATORY_CARE_PROVIDER_SITE_OTHER)
Admission: EM | Admit: 2018-05-17 | Discharge: 2018-05-17 | Disposition: A | Discharge status: Home / Self Care | Payer: BLUE CROSS/BLUE SHIELD | Source: Home / Self Care | Attending: Family Medicine | Admitting: Family Medicine

## 2018-05-17 ENCOUNTER — Other Ambulatory Visit: Payer: Self-pay

## 2018-05-17 DIAGNOSIS — M545 Low back pain, unspecified: Secondary | ICD-10-CM

## 2018-05-17 MED ORDER — METHOCARBAMOL 500 MG PO TABS: 500.0000 mg | ORAL_TABLET | Freq: Two times a day (BID) | ORAL | 0 refills | Status: DC

## 2018-05-17 NOTE — ED Provider Notes (Signed)
[facility]    CSN: [id number] Arrival date & time: 05/17/18  [id number]     History   Chief Complaint Chief Complaint  Patient presents with  . Back Pain    HPI Louis MansonRobert Vang is a 27 y.o. male.   HPI Louis MansonRobert Vang is a 27 y.o. male presenting to UC with c/o mid to lower back pain that started yesterday after doing yard work but worse this morning when he woke up. He has tried motrin with mild relief. Pain is aching and sore, sharp with movement, mild to moderate in severity. Denies radiation of pain or numbness in arms or legs. No change in bowel or bladder habits. No specific known injury.    Past Medical History:  Diagnosis Date  . Healing laceration 11/07/2011   web space right hand  . 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   Past Surgical History:  Procedure Laterality Date  . MYRINGOPLASTY  age 27  . MYRINGOPLASTY W/ PAPER PATCH  11/11/2011   Procedure: MYRINGOPLASTY WITH CIGARETTE PAPER;  Surgeon: Drema Halonhristopher E Newman, MD;  Location: [facility];  Service: ENT;  Laterality: Right;   fat graft  . TONSILLECTOMY    . TYMPANOPLASTY  11/11/2011   Procedure: TYMPANOPLASTY;  Surgeon: Drema Halonhristopher E Newman, MD;  Location: [facility];  Service: ENT;  Laterality: Left;  . WISDOM TOOTH EXTRACTION         Home Medications    Prior to Admission medications   Medication Sig Start Date End Date Taking? Authorizing Provider  ibuprofen (ADVIL,MOTRIN) 200 MG tablet Take 400 mg by mouth every 6 (six) hours as needed for headache or moderate pain.    [provider]  lisdexamfetamine (VYVANSE) 30 MG capsule Take 30 mg by mouth daily.    [provider]  methocarbamol (ROBAXIN) 500 MG tablet Take 1 tablet (500 mg total) by mouth 2 (two) times daily. 05/17/18   Lurene ShadowPhelps, Gautam Langhorst O, PA-C  Multiple Vitamin (MULTIVITAMIN WITH MINERALS) TABS tablet Take 1 tablet by  mouth every morning.    [provider]    Family History History reviewed. No pertinent family history.  Social History Social History   Tobacco Use  . Smoking status: Former Smoker    Years: 2.00    Types: Cigarettes  . Smokeless tobacco: Never Used  . Tobacco comment: 5 cig./week  Substance Use Topics  . Alcohol use: Yes    Comment: 2-3 x/week  . Drug use: No     Allergies   Patient has no known allergies.   Review of Systems Review of Systems  Genitourinary: Negative for dysuria, flank pain and hematuria.  Musculoskeletal: Positive for back pain and myalgias. Negative for arthralgias.  Neurological: Negative for weakness and numbness.     Physical Exam Triage Vital Signs ED Triage Vitals  Enc Vitals Group     BP 05/17/18 0841 128/77     Pulse Rate 05/17/18 0841 83     Resp --      Temp 05/17/18 0841 97.6 F (36.4 C)     Temp Source 05/17/18 0841 Oral     SpO2 05/17/18 0841 98 %     Weight 05/17/18 0842 165 lb (74.8 kg)     Height 05/17/18 0842 5\' 10"  (1.778 m)     Head Circumference --      Peak Flow --      Pain  Score 05/17/18 0841 8     Pain Loc --      Pain Edu? --      Excl. in GC? --    No data found.  Updated Vital Signs BP 128/77 (BP Location: Right Arm)   Pulse 83   Temp 97.6 F (36.4 C) (Oral)   Ht 5\' 10"  (1.778 m)   Wt 165 lb (74.8 kg)   SpO2 98%   BMI 23.68 kg/m   Visual Acuity Right Eye Distance:   Left Eye Distance:   Bilateral Distance:    Right Eye Near:   Left Eye Near:    Bilateral Near:     Physical Exam  Constitutional: He is oriented to person, place, and time. He appears well-developed and well-nourished.  HENT:  Head: Normocephalic and atraumatic.  Eyes: EOM are normal.  Neck: Normal range of motion.  Cardiovascular: Normal rate.  Pulmonary/Chest: Effort normal.  Musculoskeletal: Normal range of motion. He exhibits tenderness. He exhibits no edema.  No midline spinal tenderness. Mild tenderness to  bilateral  upper lumbar muscles. Full ROM UE & LE. Negative straight leg raise.   Neurological: He is alert and oriented to person, place, and time.  Skin: Skin is warm and dry.  Psychiatric: He has a normal mood and affect. His behavior is normal.  Nursing note and vitals reviewed.    UC Treatments / Results  Labs (all labs ordered are listed, but only abnormal results are displayed) Labs Reviewed - No data to display  EKG None  Radiology No results found.  Procedures Procedures (including critical care time)  Medications Ordered in UC Medications - No data to display  Initial Impression / Assessment and Plan / UC Course  I have reviewed the triage vital signs and the nursing notes.  Pertinent labs & imaging results that were available during my care of the patient were reviewed by me and considered in my medical decision making (see chart for details).     Hx and exam c/w muscle strain No red flag symptoms Encouraged conservative tx at this time.   Final Clinical Impressions(s) / UC Diagnoses   Final diagnoses:  Acute left-sided low back pain without sciatica     Discharge Instructions      You may take 500mg  acetaminophen every 4-6 hours or in combination with ibuprofen 400-600mg  every 6-8 hours as needed for pain and inflammation.  Robaxin (methocarbamol) is a muscle relaxer and may cause drowsiness. Do not drink alcohol, drive, or operate heavy machinery while taking.  Please follow up with your family doctor in 1 week if not improving.      ED Prescriptions    Medication Sig Dispense Auth. Provider   methocarbamol (ROBAXIN) 500 MG tablet Take 1 tablet (500 mg total) by mouth 2 (two) times daily. 20 tablet Lurene Shadow, PA-C     Controlled Substance Prescriptions [place] Controlled Substance Registry consulted? Not Applicable   Rolla Plate 05/17/18 1552

## 2018-05-17 NOTE — Discharge Instructions (Signed)
°  You may take 500mg  acetaminophen every 4-6 hours or in combination with ibuprofen 400-600mg  every 6-8 hours as needed for pain and inflammation.  Robaxin (methocarbamol) is a muscle relaxer and may cause drowsiness. Do not drink alcohol, drive, or operate heavy machinery while taking.  Please follow up with your family doctor in 1 week if not improving.

## 2018-05-17 NOTE — ED Triage Notes (Signed)
Mid back pain woke up today after doing yard work yesterday. Intermittent, described as sharp with movement.

## 2018-05-22 ENCOUNTER — Encounter: Payer: Self-pay | Admitting: Family Medicine

## 2018-05-22 ENCOUNTER — Emergency Department
Admission: EM | Admit: 2018-05-22 | Discharge: 2018-05-22 | Disposition: A | Discharge status: Home / Self Care | Payer: BLUE CROSS/BLUE SHIELD | Source: Home / Self Care

## 2018-05-22 ENCOUNTER — Other Ambulatory Visit: Payer: Self-pay

## 2018-05-22 ENCOUNTER — Emergency Department (INDEPENDENT_AMBULATORY_CARE_PROVIDER_SITE_OTHER): Payer: BLUE CROSS/BLUE SHIELD

## 2018-05-22 DIAGNOSIS — S46912A Strain of unspecified muscle, fascia and tendon at shoulder and upper arm level, left arm, initial encounter: Secondary | ICD-10-CM

## 2018-05-22 DIAGNOSIS — S62115A Nondisplaced fracture of triquetrum [cuneiform] bone, left wrist, initial encounter for closed fracture: Secondary | ICD-10-CM

## 2018-05-22 DIAGNOSIS — S76012A Strain of muscle, fascia and tendon of left hip, initial encounter: Secondary | ICD-10-CM

## 2018-05-22 DIAGNOSIS — M25552 Pain in left hip: Secondary | ICD-10-CM

## 2018-05-22 DIAGNOSIS — S0181XA Laceration without foreign body of other part of head, initial encounter: Secondary | ICD-10-CM

## 2018-05-22 DIAGNOSIS — M25512 Pain in left shoulder: Secondary | ICD-10-CM | POA: Diagnosis not present

## 2018-05-22 DIAGNOSIS — S62112A Displaced fracture of triquetrum [cuneiform] bone, left wrist, initial encounter for closed fracture: Secondary | ICD-10-CM

## 2018-05-22 DIAGNOSIS — S060X0A Concussion without loss of consciousness, initial encounter: Secondary | ICD-10-CM

## 2018-05-22 DIAGNOSIS — S5012XA Contusion of left forearm, initial encounter: Secondary | ICD-10-CM

## 2018-05-22 DIAGNOSIS — W11XXXA Fall on and from ladder, initial encounter: Secondary | ICD-10-CM | POA: Diagnosis not present

## 2018-05-22 MED ORDER — HYDROCODONE-ACETAMINOPHEN 5-325 MG PO TABS: 1.0000 | ORAL_TABLET | Freq: Four times a day (QID) | ORAL | 0 refills | Status: DC | PRN

## 2018-05-22 NOTE — ED Triage Notes (Signed)
Pt fell off ladder around 10 am this morning, cut left forehead, hit left shoulder, forearm and wrist.  Left hip pin, and left knee pain.  No LOS, but dizzy and unsteady for about 30 minutes after fall.

## 2018-05-22 NOTE — ED Provider Notes (Signed)
[facility]    CSN: [id number] Arrival date & time: 05/22/18  1440     History   Chief Complaint Chief Complaint  Patient presents with  . Fall    HPI Louis MansonRobert Vang is a 27 y.o. male.   27 yo established [facility] patient who fell off ladder around 10 am this morning, cut left forehead, hit left shoulder, forearm and wrist.  Left hip pin, and left knee pain.  No LOS, but dizzy and unsteady for about 30 minutes after fall.   He was cleaning leaves from roof when the bottom of the ladder skidded out.  Pain in left hip with weight bearing or rotation Swollen left forearm with minimal pain Swollen left wrist with pain on movement in any direction Pain in left shoulder with movement  No visual disturbance.  No LOC. Hit left forehead and has diagonal superficial laceration and swelling.  No headache now.  H/O chronic otitis bilaterally     Past Medical History:  Diagnosis Date  . Healing laceration 11/07/2011   web space right hand  . 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   Past Surgical History:  Procedure Laterality Date  . MYRINGOPLASTY  age 27  . MYRINGOPLASTY W/ PAPER PATCH  11/11/2011   Procedure: MYRINGOPLASTY WITH CIGARETTE PAPER;  Surgeon: Drema Halonhristopher E Newman, MD;  Location: [facility];  Service: ENT;  Laterality: Right;   fat graft  . TONSILLECTOMY    . TYMPANOPLASTY  11/11/2011   Procedure: TYMPANOPLASTY;  Surgeon: Drema Halonhristopher E Newman, MD;  Location: [facility];  Service: ENT;  Laterality: Left;  . WISDOM TOOTH EXTRACTION         Home Medications    Prior to Admission medications   Medication Sig Start Date End Date Taking? Authorizing Provider  HYDROcodone-acetaminophen (NORCO) 5-325 MG tablet Take 1 tablet by mouth every 6 (six) hours as needed for moderate pain. 05/22/18   Elvina SidleLauenstein, Kitti Mcclish, MD  ibuprofen (ADVIL,MOTRIN) 200 MG tablet Take  400 mg by mouth every 6 (six) hours as needed for headache or moderate pain.    [provider]  lisdexamfetamine (VYVANSE) 30 MG capsule Take 30 mg by mouth daily.    [provider]  methocarbamol (ROBAXIN) 500 MG tablet Take 1 tablet (500 mg total) by mouth 2 (two) times daily. 05/17/18   Lurene ShadowPhelps, Erin O, PA-C  Multiple Vitamin (MULTIVITAMIN WITH MINERALS) TABS tablet Take 1 tablet by mouth every morning.    [provider]    Family History History reviewed. No pertinent family history.  Social History Social History   Tobacco Use  . Smoking status: Former Smoker    Years: 2.00    Types: Cigarettes  . Smokeless tobacco: Never Used  . Tobacco comment: 5 cig./week  Substance Use Topics  . Alcohol use: Yes    Comment: 2-3 x/week  . Drug use: No     Allergies   Patient has no known allergies.   Review of Systems Review of Systems  Constitutional: Negative.   Cardiovascular: Negative.   Musculoskeletal: Positive for gait problem and joint swelling.  Neurological: Negative for dizziness, seizures, syncope, facial asymmetry, speech difficulty, weakness, light-headedness and headaches.  All other systems reviewed and are negative.    Physical Exam Triage Vital Signs ED Triage Vitals  Enc Vitals Group     BP 05/22/18 1525 121/71     Pulse Rate  05/22/18 1525 (!) 110     Resp 05/22/18 1525 20     Temp 05/22/18 1525 98.3 F (36.8 C)     Temp Source 05/22/18 1525 Oral     SpO2 05/22/18 1525 98 %     Weight 05/22/18 1527 164 lb (74.4 kg)     Height 05/22/18 1527 5\' 10"  (1.778 m)     Head Circumference --      Peak Flow --      Pain Score 05/22/18 1527 6     Pain Loc --      Pain Edu? --      Excl. in GC? --    No data found.  Updated Vital Signs BP 121/71 (BP Location: Left Arm)   Pulse (!) 110   Temp 98.3 F (36.8 C) (Oral)   Resp 20   Ht 5\' 10"  (1.778 m)   Wt 74.4 kg   SpO2 98%   BMI 23.53 kg/m    Physical Exam    Constitutional: He is oriented to person, place, and time. He appears well-developed and well-nourished.  HENT:  Right Ear: External ear normal.  Left Ear: External ear normal.  Mouth/Throat: Oropharynx is clear and moist.  Swollen left forehead Diagonal 2 cm superficial lac left forehead Bilateral chronic OM with perforations and clear drainage from TM's Tongue intact Teeth intact Speech is clear  Eyes: Pupils are equal, round, and reactive to light. Conjunctivae are normal.  Neck: Normal range of motion. Neck supple.  Pulmonary/Chest: Effort normal.  Musculoskeletal: He exhibits tenderness. He exhibits no deformity.  Left Hip:  Pain with intern rotation  Left shoulder:  FROM with tenderness anterior proximal humerus  Left wrist:  Swollen dorsally at distal radius, unable to flex or extend without pain  Left forearm:  Abrasion mid volar forearm with large 5 cm swelling, nontender, ecchymotic  Lymphadenopathy:    He has no cervical adenopathy.  Neurological: He is alert and oriented to person, place, and time. No cranial nerve deficit or sensory deficit. He exhibits normal muscle tone. Coordination normal.  Skin: Skin is warm and dry.  Diagonal left forehead laceration is superficial, dry and well approximated.  Nursing note and vitals reviewed.    UC Treatments / Results  Labs (all labs ordered are listed, but only abnormal results are displayed) Labs Reviewed - No data to display  EKG None  Radiology Dg Forearm Left  Result Date: 05/22/2018 CLINICAL DATA:  Fall from ladder. EXAM: LEFT FOREARM - 2 VIEW COMPARISON:  None. FINDINGS: Negative for fracture of the radius and ulna Fracture of the triquetrum dorsally with overlying soft tissue swelling. IMPRESSION: Negative radius and ulna Triquetrum fracture. Electronically Signed   By: Marlan Palau M.D.   On: 05/22/2018 16:16   Dg Wrist Complete Left  Result Date: 05/22/2018 CLINICAL DATA:  Larey Seat from ladder EXAM: LEFT  WRIST - COMPLETE 3+ VIEW COMPARISON:  None. FINDINGS: Fracture through the triquetrum. Dorsal displaced fracture fragment. No other fracture or degenerative change. IMPRESSION: Fracture of the triquetrum. Electronically Signed   By: Marlan Palau M.D.   On: 05/22/2018 16:14   Dg Shoulder Left  Result Date: 05/22/2018 CLINICAL DATA:  Fall from ladder. EXAM: LEFT SHOULDER - 2+ VIEW COMPARISON:  None. FINDINGS: There is no evidence of fracture or dislocation. There is no evidence of arthropathy or other focal bone abnormality. Soft tissues are unremarkable. IMPRESSION: Negative. Electronically Signed   By: Marlan Palau M.D.   On: 05/22/2018 16:15  Dg Hip Unilat W Or Wo Pelvis 2-3 Views Left  Result Date: 05/22/2018 CLINICAL DATA:  Larey Seat from 10 foot ladder.  Pain. EXAM: DG HIP (WITH OR WITHOUT PELVIS) 2-3V LEFT COMPARISON:  None. FINDINGS: There is no evidence of hip fracture or dislocation. There is no evidence of arthropathy or other focal bone abnormality. IMPRESSION: Negative. Electronically Signed   By: Marlan Palau M.D.   On: 05/22/2018 16:12    Procedures Procedures (including critical care time)  Medications Ordered in UC Medications - No data to display  Initial Impression / Assessment and Plan / UC Course  I have reviewed the triage vital signs and the nursing notes.  Pertinent labs & imaging results that were available during my care of the patient were reviewed by me and considered in my medical decision making (see chart for details).    Final Clinical Impressions(s) / UC Diagnoses   Final diagnoses:  Nondisplaced fracture of triquetrum (cuneiform) bone, left wrist, initial encounter for closed fracture  Shoulder strain, left, initial encounter  Hip strain, left, initial encounter  Contusion of left forearm, initial encounter  Facial laceration, initial encounter  Concussion without loss of consciousness, initial encounter     Discharge Instructions     Follow up  with orthopedics in a week or so  Rest    ED Prescriptions    Medication Sig Dispense Auth. Provider   HYDROcodone-acetaminophen (NORCO) 5-325 MG tablet Take 1 tablet by mouth every 6 (six) hours as needed for moderate pain. 12 tablet Elvina Sidle, MD     Controlled Substance Prescriptions [place] Controlled Substance Registry consulted? Not Applicable   Elvina Sidle, MD 05/22/18 1630

## 2018-05-22 NOTE — Discharge Instructions (Addendum)
Follow up with orthopedics in a week or so  Rest

## 2019-09-18 IMAGING — DX DG FOREARM 2V*L*
2 series · 2 of 2 positions shown · non-contrast
Comparison: None.

CLINICAL DATA: Fall from ladder.

EXAM:
LEFT FOREARM - 2 VIEW

[forearm ap]
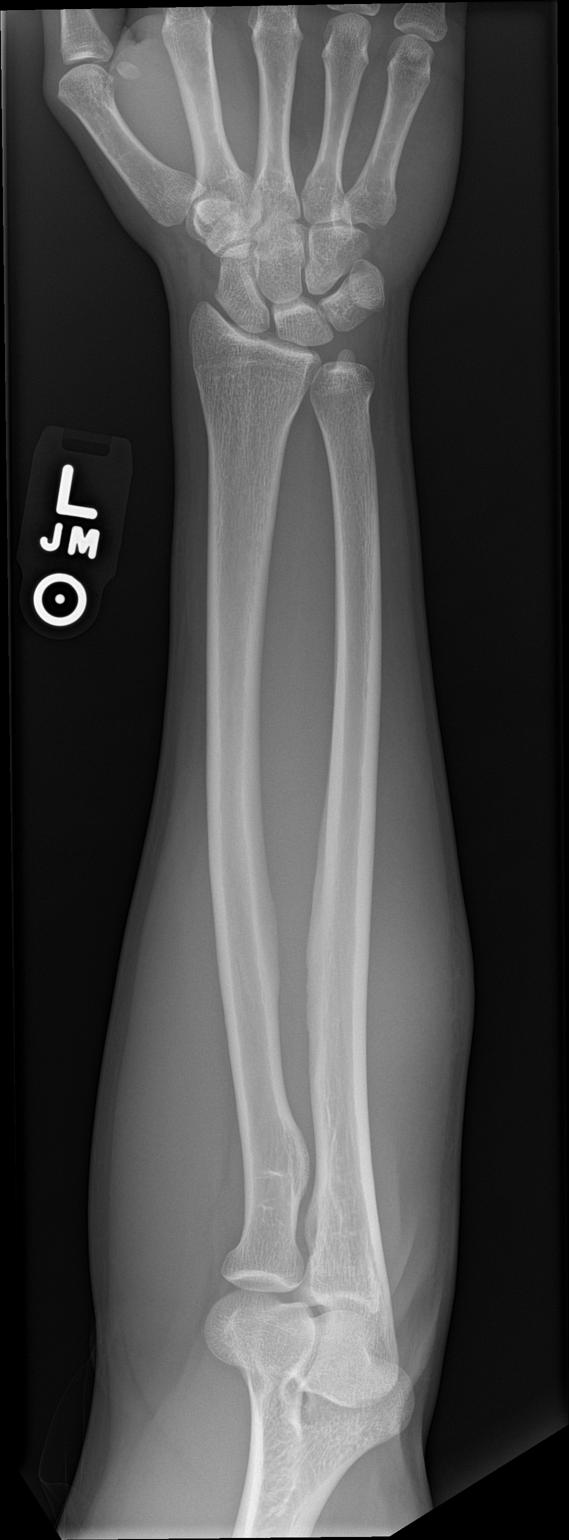

[forearm lat]
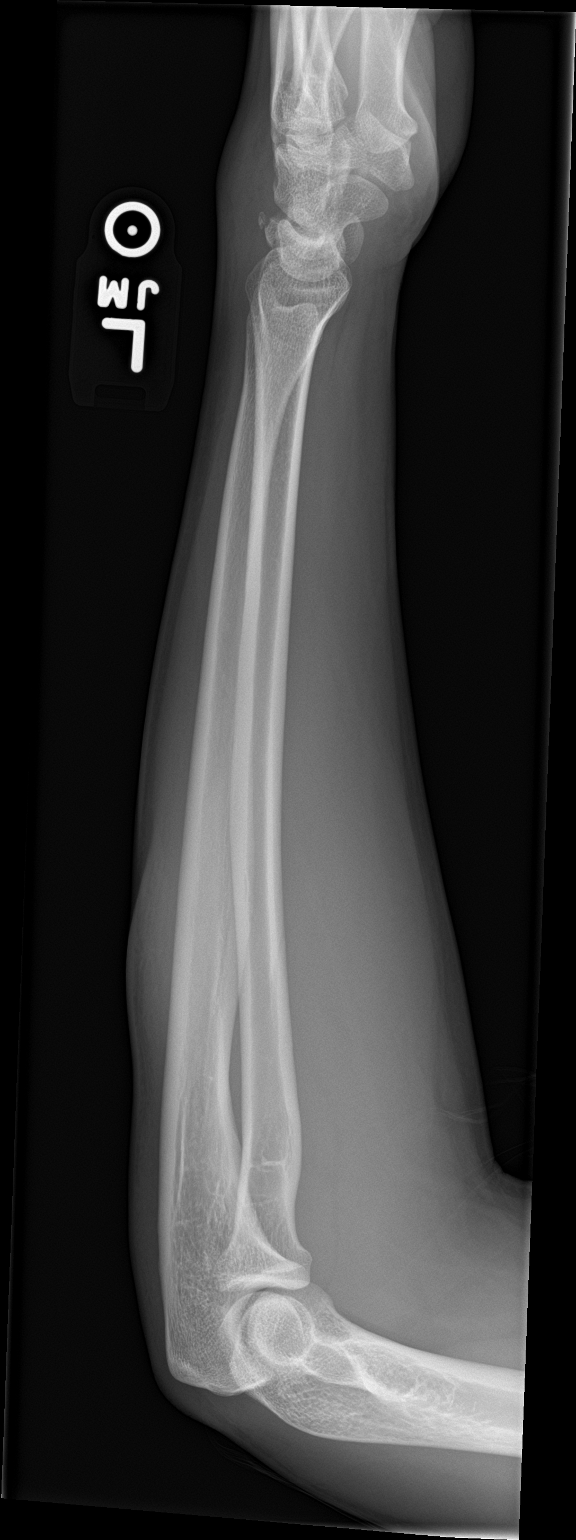

[2 of 2 positions shown; findings below may reference images not displayed]

FINDINGS: Negative for fracture of the radius and ulna

Fracture of the triquetrum dorsally with overlying soft tissue
swelling.
IMPRESSION: Negative radius and ulna

Triquetrum fracture.

## 2019-09-18 IMAGING — DX DG WRIST COMPLETE 3+V*L*
4 series · 4 of 4 positions shown · non-contrast
Comparison: None.

CLINICAL DATA: Fell from ladder

EXAM:
LEFT WRIST - COMPLETE 3+ VIEW

[wrist pa]
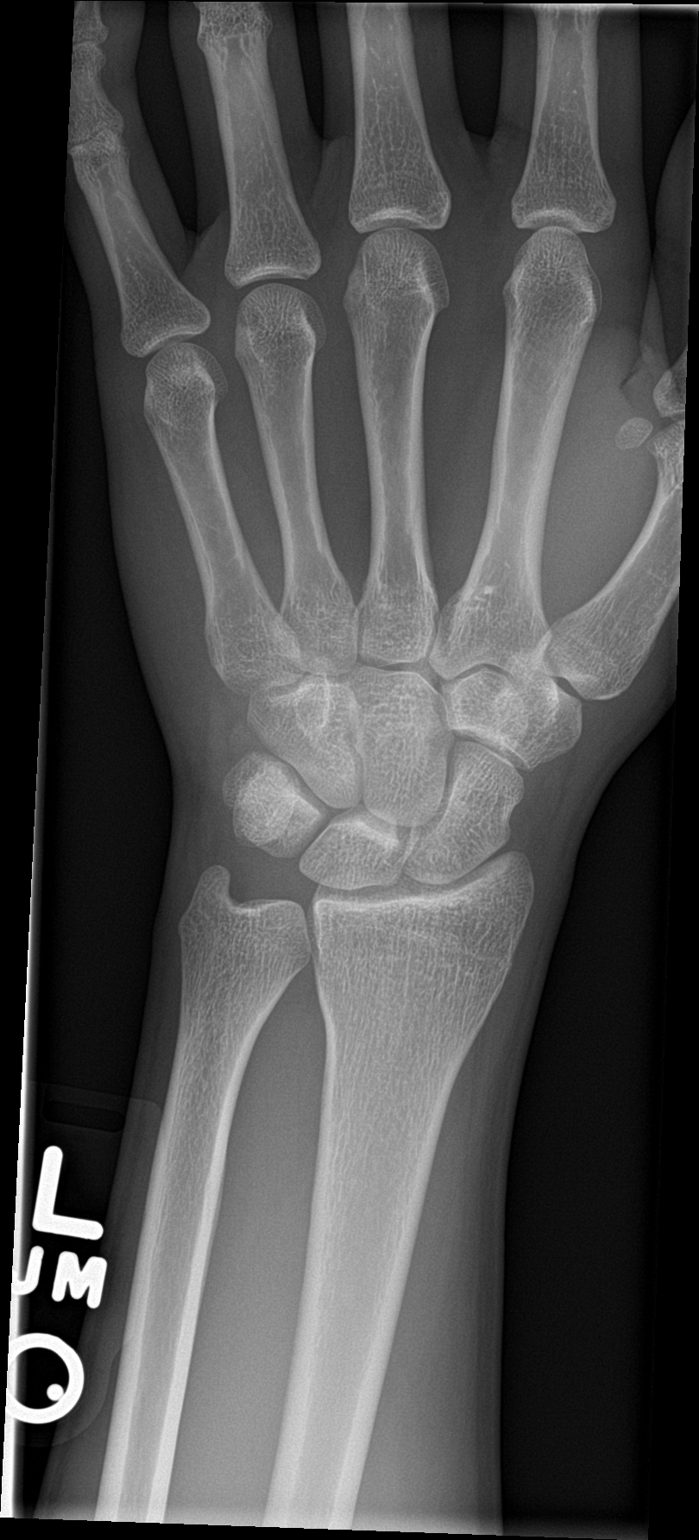

[wrist obl]
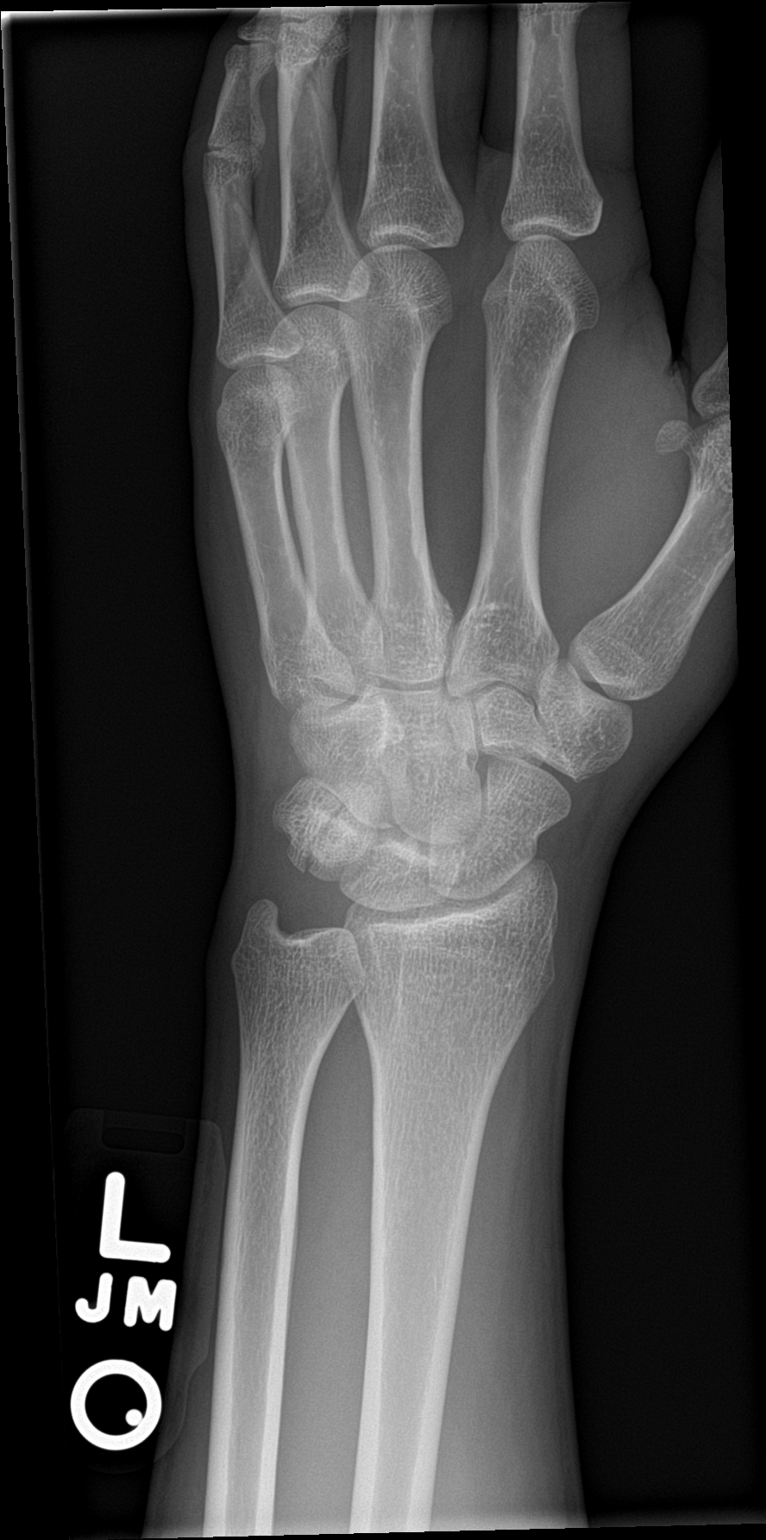

[wrist lat]
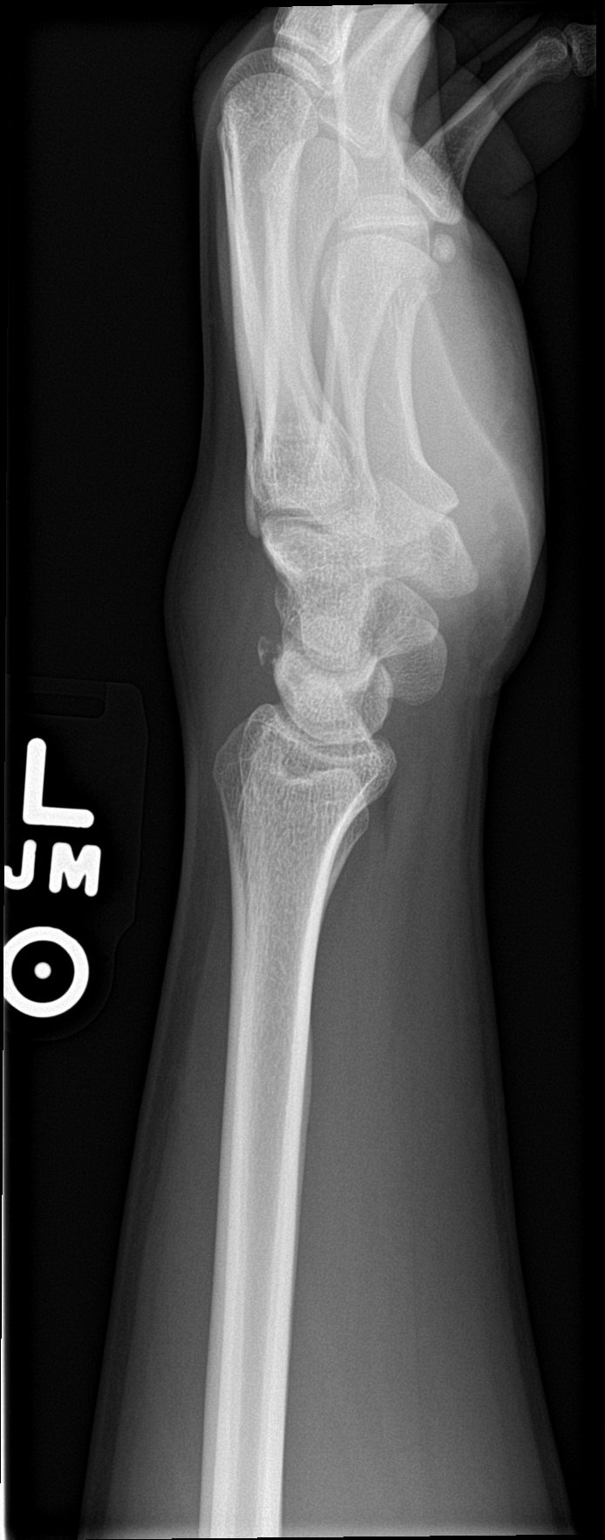

[wrist navicular]
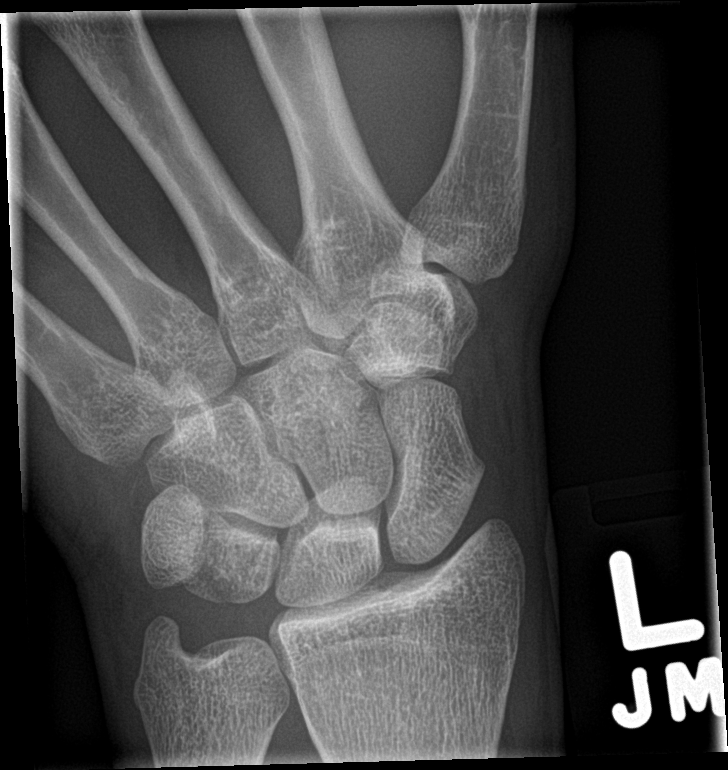

[4 of 4 positions shown; findings below may reference images not displayed]

FINDINGS: Fracture through the triquetrum. Dorsal displaced fracture fragment.
No other fracture or degenerative change.
IMPRESSION: Fracture of the triquetrum.

## 2019-09-20 ENCOUNTER — Ambulatory Visit: Payer: Self-pay | Attending: Internal Medicine

## 2019-09-20 DIAGNOSIS — Z23 Encounter for immunization: Secondary | ICD-10-CM

## 2019-09-20 NOTE — Progress Notes (Signed)
   Covid-19 Vaccination Clinic  Name:  Louis Vang    MRN: [id number] DOB: Jun 24, 1991  09/20/2019  [name] was observed post Covid-19 immunization for 15 minutes without incident. He was provided with Vaccine Information Sheet and instruction to access the V-Safe system.   [name] was instructed to call 911 with any severe reactions post vaccine: Marland Kitchen Difficulty breathing  . Swelling of face and throat  . A fast heartbeat  . A bad rash all over body  . Dizziness and weakness   Immunizations Administered    Name Date Dose VIS Date Route   Moderna COVID-19 Vaccine 09/20/2019  1:06 PM 0.5 mL 05/28/2019 Intramuscular   Manufacturer: Moderna   Lot: [id number]   NDC: 32671-245-80

## 2019-10-23 ENCOUNTER — Ambulatory Visit: Payer: Self-pay | Attending: Internal Medicine

## 2019-10-23 DIAGNOSIS — Z23 Encounter for immunization: Secondary | ICD-10-CM

## 2019-10-23 NOTE — Progress Notes (Signed)
   Covid-19 Vaccination Clinic  Name:  Louis Vang    MRN: [id number] DOB: [birth year]  10/23/2019  Mr. Louis Vang was observed post Covid-19 immunization for 15 minutes without incident. He was provided with Vaccine Information Sheet and instruction to access the V-Safe system.   Mr. Louis Vang was instructed to call 911 with any severe reactions post vaccine: Marland Kitchen Difficulty breathing  . Swelling of face and throat  . A fast heartbeat  . A bad rash all over body  . Dizziness and weakness   Immunizations Administered    Name Date Dose VIS Date Route   Moderna COVID-19 Vaccine 10/23/2019 11:48 AM 0.5 mL 05/2019 Intramuscular   Manufacturer: Moderna   Lot: [id number]   NDC: 76546-503-54

## 2020-06-18 ENCOUNTER — Emergency Department: Admit: 2020-06-18 | Discharge status: Absent without leave | Payer: Self-pay | Source: Home / Self Care

## 2020-06-18 ENCOUNTER — Other Ambulatory Visit: Payer: Self-pay

## 2020-06-18 ENCOUNTER — Emergency Department
Admission: EM | Admit: 2020-06-18 | Discharge: 2020-06-18 | Disposition: A | Discharge status: Home / Self Care | Payer: 59 | Source: Home / Self Care | Attending: Family Medicine | Admitting: Family Medicine

## 2020-06-18 DIAGNOSIS — F418 Other specified anxiety disorders: Secondary | ICD-10-CM

## 2020-06-18 DIAGNOSIS — U071 COVID-19: Secondary | ICD-10-CM | POA: Diagnosis not present

## 2020-06-18 MED ORDER — HYDROXYZINE HCL 25 MG PO TABS: ORAL_TABLET | ORAL | 0 refills | Status: DC

## 2020-06-18 NOTE — ED Triage Notes (Signed)
Pt here today for shortness of breath. Tested pos for COVID on Monday with at home test. Monitoring  Pulse ox at home, ranging from 95-99%. Taking ibuprofen prn. Last dose 330pm.

## 2020-06-18 NOTE — ED Provider Notes (Signed)
[facility]   [id number] 06/18/20 Arrival Time: 1755  ASSESSMENT & PLAN:  1. COVID-19 virus infection   2. Situational anxiety     Worries over COVID infection. VSS; clear lungs. Reassured. OTC symptom care as needed.  If needed. Meds ordered this encounter  Medications   hydrOXYzine (ATARAX/VISTARIL) 25 MG tablet    Sig: Take 1-2 tablets every 6 hours as needed for anxiety.    Dispense:  20 tablet    Refill:  0     Follow-up Information    Burton Apley, MD.   Specialty: Internal Medicine Why: As needed. Contact information: [address] [place] [address] [phone]        [facility] EMERGENCY DEPARTMENT.   Specialty: Emergency Medicine Why: If symptoms worsen in any way. Contact information: [address] [id number] [place] [address] [phone]              Reviewed expectations re: course of current medical issues. Questions answered. Outlined signs and symptoms indicating need for more acute intervention. Understanding verbalized. After Visit Summary given.   SUBJECTIVE: History from: patient. Louis Vang is a 29 y.o. male who reports testing positive for COVID 4 d ago. Coughing. No specific SOB. Feels like he is having panic attacks related to stress over + COVID. Denies: fever. Normal PO intake without n/v/d.    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   Social History   Socioeconomic History   Marital status: Single    Spouse name: Not on file   Number of children: Not on file   Years of education: Not on file   Highest education level: Not on file  Occupational History   Not on file  Tobacco Use   Smoking status: Former Smoker    Years: 2.00    Types: Cigarettes   Smokeless tobacco: Never Used   Tobacco comment: 5 cig./week  Substance and Sexual Activity   Alcohol use: Yes    Comment: 2-3 x/week   Drug use: No   Sexual activity: Not on file  Other Topics Concern   Not on file  Social History Narrative   Not on file   Social Determinants of Health   Financial Resource Strain: Not on file  Food Insecurity: Not on file  Transportation Needs: Not on file  Physical Activity: Not on file  Stress: Not on file  Social Connections: Not on file  Intimate Partner Violence: Not on file   History reviewed. No pertinent family history. Past Surgical History:  Procedure Laterality Date   MYRINGOPLASTY  age 67   MYRINGOPLASTY W/ PAPER PATCH  11/11/2011   Procedure: MYRINGOPLASTY WITH CIGARETTE PAPER;  Surgeon: Drema Halon, MD;  Location: [facility];  Service: ENT;  Laterality: Right;   fat graft   TONSILLECTOMY     TYMPANOPLASTY  11/11/2011   Procedure:  TYMPANOPLASTY;  Surgeon: Drema Halon, MD;  Location: [facility];  Service: ENT;  Laterality: Left;   WISDOM TOOTH EXTRACTION       Mardella Layman, MD 06/18/20 [phone]

## 2020-07-01 ENCOUNTER — Other Ambulatory Visit: Payer: Self-pay

## 2020-07-01 ENCOUNTER — Emergency Department: Admit: 2020-07-01 | Discharge status: Absent without leave | Payer: Self-pay

## 2020-07-01 ENCOUNTER — Emergency Department
Admission: EM | Admit: 2020-07-01 | Discharge: 2020-07-01 | Disposition: A | Discharge status: Home / Self Care | Payer: 59 | Source: Home / Self Care | Attending: Family Medicine | Admitting: Family Medicine

## 2020-07-01 DIAGNOSIS — R059 Cough, unspecified: Secondary | ICD-10-CM | POA: Diagnosis not present

## 2020-07-01 NOTE — Discharge Instructions (Addendum)
Please try to avoid eating late at night  Please try an anti-reflux medication.  Please follow up if your symptoms fail to improve.

## 2020-07-01 NOTE — ED Provider Notes (Signed)
[facility]    CSN: [id number] Arrival date & time: 07/01/20  1536      History   Chief Complaint Chief Complaint  Patient presents with  . Cough  . Nasal Congestion    HPI Louis Vang is a 30 y.o. male. He is presenting with dry cough and feeling of chocking. Has tested negative for COVID since testing positive a few weeks ago. Having fatigue, loose stools and lack of appetite. Symptoms started the past couple of days. No fevers.   HPI  Past Medical History:  Diagnosis Date  . Healing laceration 11/07/2011   web space right hand  . 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   Past Surgical History:  Procedure Laterality Date  . MYRINGOPLASTY  age 30  . MYRINGOPLASTY W/ PAPER PATCH  11/11/2011   Procedure: MYRINGOPLASTY WITH CIGARETTE PAPER;  Surgeon: Drema Halon, MD;  Location: [facility];  Service: ENT;  Laterality: Right;   fat graft  . TONSILLECTOMY    . TYMPANOPLASTY  11/11/2011   Procedure: TYMPANOPLASTY;  Surgeon: Drema Halon, MD;  Location: [facility];  Service: ENT;  Laterality: Left;  . WISDOM TOOTH EXTRACTION         Home Medications    Prior to Admission medications   Medication Sig Start Date End Date Taking? Authorizing Provider  hydrOXYzine (ATARAX/VISTARIL) 25 MG tablet Take 1-2 tablets every 6 hours as needed for anxiety. 06/18/20   Mardella Layman, MD  ibuprofen (ADVIL,MOTRIN) 200 MG tablet Take 400 mg by mouth every 6 (six) hours as needed for headache or moderate pain.    [provider]  lisdexamfetamine (VYVANSE) 30 MG capsule Take 30 mg by mouth daily.    [provider]  Multiple Vitamin (MULTIVITAMIN WITH MINERALS) TABS tablet Take 1 tablet by mouth every morning.    [provider]    Family History History reviewed. No pertinent family history.  Social History Social History   Tobacco  Use  . Smoking status: Former Smoker    Years: 2.00    Types: Cigarettes  . Smokeless tobacco: Never Used  . Tobacco comment: 5 cig./week  Substance Use Topics  . Alcohol use: Yes    Comment: 2-3 x/week  . Drug use: No     Allergies   Antihistamines, diphenhydramine-type   Review of Systems Review of Systems  See HPI   Physical Exam Triage Vital Signs ED Triage Vitals [07/01/20 1714]  Enc Vitals Group     BP      Pulse      Resp      Temp      Temp src      SpO2      Weight      Height      Head Circumference      Peak Flow      Pain Score 1     Pain Loc      Pain Edu?      Excl. in GC?    No data found.  Updated Vital Signs BP 118/87 (BP Location: Right Arm)   Pulse 94   Temp 98.7 F (37.1 C) (Oral)   Resp 17   SpO2 96%   Visual Acuity Right Eye Distance:   Left Eye Distance:   Bilateral Distance:    Right Eye Near:   Left Eye Near:    Bilateral Near:  Physical Exam Gen: NAD, alert, cooperative with exam, well-appearing ENT: normal lips, normal nasal mucosa, tympanic membranes clear and Eye: normal EOM, normal conjunctiva and lids CV:   regular rate and rhythm, S1-S2   Resp: no accessory muscle use, non-labored, clear to auscultation bilaterally, no crackles or wheezes Neuro: normal tone, normal sensation to touch Psych:  normal insight, alert and oriented MSK: Normal gait, normal strength    UC Treatments / Results  Labs (all labs ordered are listed, but only abnormal results are displayed) Labs Reviewed - No data to display  EKG   Radiology No results found.  Procedures Procedures (including critical care time)  Medications Ordered in UC Medications - No data to display  Initial Impression / Assessment and Plan / UC Course  I have reviewed the triage vital signs and the nursing notes.  Pertinent labs & imaging results that were available during my care of the patient were reviewed by me and considered in my medical  decision making (see chart for details).     Mr. Louis Vang is a 30 yo M that is presenting with dry cough and globus sensation. Symptoms may be related to reflux with dry cough. He has tried a medrol dose pack and an inhaler. Exam is reassuring. Could consider an anti-reflux medication. Counseled on supportive care. Given indications to return.   Final Clinical Impressions(s) / UC Diagnoses   Final diagnoses:  Cough     Discharge Instructions     Please try to avoid eating late at night  Please try an anti-reflux medication.  Please follow up if your symptoms fail to improve.     ED Prescriptions    None     PDMP not reviewed this encounter.   Myra Rude, MD 07/01/20 [phone]

## 2020-07-01 NOTE — ED Triage Notes (Signed)
Pt c/o continued dry cough and nasal congestion. Also states he feels like he has trouble taking deep breaths Teledoc appt today, was rx'd albuterol which helped some. Some diarrhea today. COVID pos 12/20.

## 2020-07-30 ENCOUNTER — Ambulatory Visit (INDEPENDENT_AMBULATORY_CARE_PROVIDER_SITE_OTHER): Payer: PRIVATE HEALTH INSURANCE | Admitting: Nurse Practitioner

## 2020-07-30 VITALS — HR 88 | Temp 97.9°F | Resp 18

## 2020-07-30 DIAGNOSIS — U099 Post covid-19 condition, unspecified: Secondary | ICD-10-CM

## 2020-07-30 DIAGNOSIS — R5382 Chronic fatigue, unspecified: Secondary | ICD-10-CM | POA: Diagnosis not present

## 2020-07-30 DIAGNOSIS — Z7409 Other reduced mobility: Secondary | ICD-10-CM | POA: Diagnosis not present

## 2020-07-30 DIAGNOSIS — R0609 Other forms of dyspnea: Secondary | ICD-10-CM | POA: Diagnosis not present

## 2020-07-30 MED ORDER — PREDNISONE 20 MG PO TABS: 20.0000 mg | ORAL_TABLET | Freq: Every day | ORAL | 0 refills | Status: AC

## 2020-07-30 MED ORDER — PREDNISONE 20 MG PO TABS: 20.0000 mg | ORAL_TABLET | Freq: Every day | ORAL | 0 refills | Status: DC

## 2020-07-30 NOTE — Progress Notes (Signed)
@Patient  ID: , male    DOB: [birth year], 30 y.o.   MRN: [id number]  Chief Complaint  Patient presents with  . Covid Positive    Tested +06/15/20, shortness of breath, elevated heart rate,     Referring provider: 06/17/20, MD   30 year old male with no significant health history  HPI  Patient presents today for post COVID care clinic visit.  Patient states that he tested positive for Covid on 06/15/2020.  He states that he is still experiencing shortness of breath and elevated heart rate at times.  Vital signs are stable in office today.  We will check labs and x-ray today.  We discussed that most likely symptoms are due to deconditioning.  We will place a referral to physical therapy. Denies f/c/s, n/v/d, hemoptysis, PND, chest pain or edema.     Allergies  Allergen Reactions  . Antihistamines, Diphenhydramine-Type     Immunization History  Administered Date(s) Administered  . Moderna Sars-Covid-2 Vaccination 09/20/2019, 10/23/2019    Past Medical History:  Diagnosis Date  . Healing laceration 11/07/2011   web space right hand  . 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 Years: 2.00  . Types: Cigarettes  Smokeless Tobacco Never Used  Tobacco Comment   5 cig./week   Counseling given: Yes Comment: 5 cig./week   Outpatient Encounter Medications as of 07/30/2020  Medication Sig  . predniSONE (DELTASONE) 20 MG tablet Take 1 tablet (20 mg total) by mouth daily with breakfast for 5 days.  . [DISCONTINUED] predniSONE (DELTASONE) 20 MG tablet Take 1 tablet (20 mg total) by mouth daily with breakfast for 5 days.  . hydrOXYzine (ATARAX/VISTARIL) 25 MG tablet Take 1-2 tablets every 6 hours as needed for anxiety.  09/27/2020 ibuprofen (ADVIL,MOTRIN) 200 MG tablet Take 400 mg by mouth every 6 (six) hours as needed for headache or moderate pain.  Marland Kitchen lisdexamfetamine (VYVANSE)  30 MG capsule Take 30 mg by mouth daily.  . Multiple Vitamin (MULTIVITAMIN WITH MINERALS) TABS tablet Take 1 tablet by mouth every morning.   No facility-administered encounter medications on file as of 07/30/2020.     Review of Systems  Review of Systems  Constitutional: Negative.  Negative for fatigue and fever.  HENT: Negative.   Respiratory: Positive for shortness of breath. Negative for cough.   Cardiovascular: Negative.  Negative for chest pain, palpitations and leg swelling.  Gastrointestinal: Negative.   Allergic/Immunologic: Negative.   Neurological: Negative.   Psychiatric/Behavioral: Negative.        Physical Exam  Pulse 88   Temp 97.9 F (36.6 C)   Resp 18   SpO2 96% Comment: RA  Wt Readings from Last 5 Encounters:  05/22/18 164 lb (74.4 kg)  05/17/18 165 lb (74.8 kg)  02/03/18 158 lb 12 oz (72 kg)  11/07/11 138 lb (62.6 kg)     Physical Exam Vitals and nursing note reviewed.  Constitutional:      General: He is not in acute distress.    Appearance: He is well-developed and well-nourished.  Cardiovascular:     Rate and Rhythm: Normal rate and regular rhythm.  Pulmonary:     Effort: Pulmonary effort is normal.     Breath sounds: Normal breath sounds.  Musculoskeletal:     Right lower leg: No edema.     Left lower leg: No edema.  Skin:    General: Skin is warm  and dry.  Neurological:     Mental Status: He is alert and oriented to person, place, and time.  Psychiatric:        Mood and Affect: Mood and affect and mood normal.        Behavior: Behavior normal.        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 [place], [place] [address] [phone] MON - FRI 8:00 AM - 4:00 PM - WALK IN  Will  order labs  Will order prednisone   Follow up:  Follow up in 2 weeks or sooner if needed      Ivonne Andrew, NP 08/03/2020

## 2020-07-30 NOTE — Patient Instructions (Addendum)
History of Covid 19 Shortness of breath Fatigue:   Stay well hydrated  Stay active  Deep breathing exercises  May take tylenol for fever or pain   Will order chest x ray:  [facility] [address] [place], [place] [address] [phone] MON - FRI 8:00 AM - 4:00 PM - WALK IN  Will order labs  Will order prednisone   Follow up:  Follow up in 2 weeks or sooner if needed

## 2020-07-31 ENCOUNTER — Ambulatory Visit
Admission: RE | Admit: 2020-07-31 | Discharge: 2020-07-31 | Disposition: A | Discharge status: Home / Self Care | Payer: PRIVATE HEALTH INSURANCE | Source: Ambulatory Visit | Attending: Nurse Practitioner | Admitting: Nurse Practitioner

## 2020-08-01 LAB — COMPREHENSIVE METABOLIC PANEL
ALT: 15 IU/L (ref 0–44)
AST: 21 IU/L (ref 0–40)
Albumin/Globulin Ratio: 1.7 (ref 1.2–2.2)
Albumin: 5 g/dL (ref 4.1–5.2)
Alkaline Phosphatase: 85 IU/L (ref 44–121)
BUN/Creatinine Ratio: 9 (ref 9–20)
BUN: 9 mg/dL (ref 6–20)
Bilirubin Total: 0.3 mg/dL (ref 0.0–1.2)
CO2: 26 mmol/L (ref 20–29)
Calcium: 10.2 mg/dL (ref 8.7–10.2)
Chloride: 101 mmol/L (ref 96–106)
Creatinine, Ser: 1 mg/dL (ref 0.76–1.27)
GFR calc Af Amer: 117 mL/min/{1.73_m2} (ref >59)
GFR calc non Af Amer: 101 mL/min/{1.73_m2} (ref >59)
Globulin, Total: 3 g/dL (ref 1.5–4.5)
Glucose: 158 mg/dL — ABNORMAL HIGH (ref 65–99)
Potassium: 4.7 mmol/L (ref 3.5–5.2)
Sodium: 139 mmol/L (ref 134–144)
Total Protein: 8 g/dL (ref 6.0–8.5)

## 2020-08-01 LAB — CBC
Hematocrit: 43.5 % (ref 37.5–51.0)
Hemoglobin: 15.3 g/dL (ref 13.0–17.7)
MCH: 30.4 pg (ref 26.6–33.0)
MCHC: 35.2 g/dL (ref 31.5–35.7)
MCV: 86 fL (ref 79–97)
Platelets: 397 10*3/uL (ref 150–450)
RBC: 5.04 x10E6/uL (ref 4.14–5.80)
RDW: 12.4 % (ref 11.6–15.4)
WBC: 10.4 10*3/uL (ref 3.4–10.8)

## 2020-08-03 DIAGNOSIS — R41840 Attention and concentration deficit: Secondary | ICD-10-CM | POA: Insufficient documentation

## 2020-08-03 DIAGNOSIS — R0609 Other forms of dyspnea: Secondary | ICD-10-CM | POA: Insufficient documentation

## 2020-08-03 DIAGNOSIS — U099 Post covid-19 condition, unspecified: Secondary | ICD-10-CM | POA: Insufficient documentation

## 2020-08-03 DIAGNOSIS — G9332 Myalgic encephalomyelitis/chronic fatigue syndrome: Secondary | ICD-10-CM | POA: Insufficient documentation

## 2020-08-03 NOTE — Assessment & Plan Note (Signed)
Shortness of breath Fatigue:   Stay well hydrated  Stay active  Deep breathing exercises  May take tylenol for fever or pain   Will order chest x ray:  [facility] [address] [place], [place] [address] [phone] MON - FRI 8:00 AM - 4:00 PM - WALK IN  Will order labs  Will order prednisone   Follow up:  Follow up in 2 weeks or sooner if needed

## 2020-08-06 ENCOUNTER — Other Ambulatory Visit: Payer: Self-pay

## 2020-08-06 ENCOUNTER — Encounter: Payer: Self-pay | Admitting: Physical Therapy

## 2020-08-06 ENCOUNTER — Ambulatory Visit (INDEPENDENT_AMBULATORY_CARE_PROVIDER_SITE_OTHER): Payer: PRIVATE HEALTH INSURANCE | Admitting: Physical Therapy

## 2020-08-06 VITALS — BP 120/82 | HR 75

## 2020-08-06 DIAGNOSIS — R29898 Other symptoms and signs involving the musculoskeletal system: Secondary | ICD-10-CM

## 2020-08-06 NOTE — Therapy (Signed)
[facility] [address] [place], [place], [address] Phone: [phone]   Fax:  [phone]  Physical Therapy Evaluation  Patient Details  Name: Louis Vang MRN: [id number] Date of Birth: 03/02/1991 Referring Provider (PT): Angus Seller NP   Encounter Date: 08/06/2020   PT End of Session - 08/06/20 1146    Visit Number 1    Number of Visits 8    Date for PT Re-Evaluation 09/03/20    PT Start Time 1146    PT Stop Time 1127    PT Time Calculation (min) 1421 min    Activity Tolerance Patient limited by fatigue    Behavior During Therapy [facility] for tasks assessed/performed           Past Medical History:  Diagnosis Date  . Healing laceration 11/07/2011   web space right hand  . 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 MYRINGOPLASTY  age 15  . MYRINGOPLASTY W/ PAPER PATCH  11/11/2011   Procedure: MYRINGOPLASTY WITH CIGARETTE PAPER;  Surgeon: Drema Halon, MD;  Location: [facility];  Service: ENT;  Laterality: Right;   fat graft  . TONSILLECTOMY    . TYMPANOPLASTY  11/11/2011   Procedure: TYMPANOPLASTY;  Surgeon: Drema Halon, MD;  Location: [facility];  Service: ENT;  Laterality: Left;  . WISDOM TOOTH EXTRACTION      Vitals:   08/06/20 1153  BP: 120/82  Pulse: 75  SpO2: 98%      Subjective Assessment - 08/06/20 1147    Subjective Pt was dx with covid on 06/15/2020, he had pretty significant case.  He has been working on increasing his walking, built up to 30' however he had a flare up last week and now he is back to baseline with walking/breathing/fatigue. performing diaphgramatic breathing    How long can you walk comfortably? winded walking to the mailbox    Patient Stated Goals not sure - would like to feel back to normal    Currently in Pain? No/denies   occassional pain in the chest - was on  prednisone a couple weeks ago.             [facility] PT Assessment - 08/06/20 0001      Assessment   Medical Diagnosis Deconditioning, long haul covid 19, dyspnea and fatigue    Referring Provider (PT) Angus Seller NP    Onset Date/Surgical Date 06/15/20    Next MD Visit 08/15/2019    Prior Therapy none      Precautions   Precautions None      Balance Screen   Has the patient fallen in the past 6 months No    Has the patient had a decrease in activity level because of a fear of falling?  No    Is the patient reluctant to leave their home because of a fear of falling?  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    Leisure golfing, cylcing on street , going to gym      Posture/Postural Control   Posture/Postural Control Postural limitations    Postural Limitations Rounded Shoulders;Forward head;Increased thoracic kyphosis   seated   Posture Comments able to correct with  cues      ROM / Strength   AROM / PROM / Strength --   bilat UE's WNL, LE's WNL except hip abduction 4/5     Transfers   Transfers Independent with all Transfers    Comments 10 sit to stands O2 96%, HR 98bpm  precieved level of exerction 7/10      Ambulation/Gait   Ambulation/Gait --   observed in clinic, does multiple deep breaths walking from lobby to treatment room.     6 Minute Walk- Baseline   6 Minute Walk- Baseline yes    BP (mmHg) 120/82    HR (bpm) 75    02 Sat (%RA) 98 %      6 Minute walk- Post Test   6 Minute Walk Post Test yes    BP (mmHg) 118/81    HR (bpm) 79    02 Sat (%RA) 98 %    Modified Borg Scale for Dyspnea 8-    Perceived Rate of Exertion (Borg) 15- Hard      6 minute walk test results    Aerobic Endurance Distance Walked 136                      Objective measurements  completed on examination: See above findings.               PT Education - 08/06/20 1242    Education Details HEP and POC    Person(s) Educated Patient    Methods Explanation;Demonstration;Handout    Comprehension Returned demonstration;Verbalized understanding               PT Long Term Goals - 08/06/20 1251      PT LONG TERM GOAL #1   Title I with advanced HEP to include walking program and return to gym    Time 4    Period Weeks    Status New    Target Date 09/03/20      PT LONG TERM GOAL #2   Title complete 1800 feet in  6 min walking test with no more than 3/10 on the exertion scale    Time 4    Period Weeks    Status New    Target Date 09/03/20      PT LONG TERM GOAL #3   Title demo monitoring vitals during areobic activities  and verbalize safe measures for his age    Time 4    Period Weeks    Status New    Target Date 09/03/20      PT LONG TERM GOAL #4   Title perform 10 reps sit to stand with ease and no SOB    Time 4    Period Weeks    Status New    Target Date 09/03/20                  Plan - 08/06/20 1243    Clinical Impression Statement 30 yo male s/p covid on 06/15/2020.  He reports he was doing well with breathing work and increased walking to 30' until a couple weeks ago.  He developed increase dyspnea and fatigue.  Home test was negative.  He feels like he is starting over and is afraid to do to much and relapse again.  It was observed that he would take an extra deep breath every couple of mins druing initial assessment. His BP stayed WNL and oxygen sats stayed 96-98% throughout treatment,  HR did go up with  sit to stands and his perceived level of work was high.  6 min walk test was very limited for his age to 136 feet with high level of exertion and perceived level of dypsnea.  He would beneift from PT to instruct in safe exercise and monitoring of vitals while returning to his baseline level of function which includes playing  golf, and going to the gym.    Personal Factors and Comorbidities Comorbidity 1    Examination-Activity Limitations Stairs    Examination-Participation Restrictions Community Activity    Stability/Clinical Decision Making Stable/Uncomplicated    Clinical Decision Making Low    Rehab Potential Excellent    PT Frequency 2x / week    PT Duration 4 weeks    PT Treatment/Interventions Patient/family education;Functional mobility training;Moist Heat;Therapeutic exercise;Therapeutic activities;Neuromuscular re-education;Manual techniques    PT Next Visit Plan work on endurance, exercise tolerance, deep breathing work and instruct in monitoring vitals    Consulted and Agree with Plan of Care Patient           Patient will benefit from skilled therapeutic intervention in order to improve the following deficits and impairments:  Difficulty walking,Decreased endurance,Decreased activity tolerance  Visit Diagnosis: Other symptoms and signs involving the musculoskeletal system - Plan: PT plan of care cert/re-cert     Problem List Patient Active Problem List   Diagnosis Date Noted  . COVID-19 long hauler manifesting chronic fatigue 08/03/2020  . COVID-19 long hauler manifesting chronic dyspnea 08/03/2020  . 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

## 2020-08-06 NOTE — Patient Instructions (Signed)
Access Code: [email or web address] URL: [email or web address] Date: 08/06/2020 Prepared by: Roderic Scarce  Exercises Standing Pursed Lip Breathing with Ribcage Elevation - 4-5 x daily - 10 reps Supine Diaphragmatic Breathing - 2 x daily - 10 reps Sit to Stand - 2 x daily - 1-3 sets - 10 reps  Patient Education walking program

## 2020-08-11 ENCOUNTER — Other Ambulatory Visit: Payer: Self-pay

## 2020-08-11 ENCOUNTER — Encounter: Payer: Self-pay | Admitting: Physical Therapy

## 2020-08-11 ENCOUNTER — Ambulatory Visit (INDEPENDENT_AMBULATORY_CARE_PROVIDER_SITE_OTHER): Payer: PRIVATE HEALTH INSURANCE | Admitting: Physical Therapy

## 2020-08-11 VITALS — HR 86

## 2020-08-11 DIAGNOSIS — R29898 Other symptoms and signs involving the musculoskeletal system: Secondary | ICD-10-CM

## 2020-08-11 NOTE — Therapy (Signed)
[facility] [place] [address] [place], [place], [address] Phone: [phone]   Fax:  [phone]  Physical Therapy Treatment  Patient Details  Name: Louis Vang MRN: [id number] Date of Birth: 1991-05-06 Referring Provider (PT): Angus Seller NP   Encounter Date: 08/11/2020   PT End of Session - 08/11/20 1237    Visit Number 2    Number of Visits 8    Date for PT Re-Evaluation 09/03/20    PT Start Time 1149    PT Stop Time 1232    PT Time Calculation (min) 43 min    Activity Tolerance Patient tolerated treatment well    Behavior During Therapy [facility] for tasks assessed/performed           Past Medical History:  Diagnosis Date  . Healing laceration 11/07/2011   web space right hand  . 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 MYRINGOPLASTY  age 39  . MYRINGOPLASTY W/ PAPER PATCH  11/11/2011   Procedure: MYRINGOPLASTY WITH CIGARETTE PAPER;  Surgeon: Drema Halon, MD;  Location: [facility];  Service: ENT;  Laterality: Right;   fat graft  . TONSILLECTOMY    . TYMPANOPLASTY  11/11/2011   Procedure: TYMPANOPLASTY;  Surgeon: Drema Halon, MD;  Location: [facility];  Service: ENT;  Laterality: Left;  . WISDOM TOOTH EXTRACTION      Vitals:   08/11/20 1154  Pulse: 86  SpO2: 98%     Subjective Assessment - 08/11/20 1239    Subjective Pt reports he has been completing his HEP and walking program.  Increasing his walking time.  He has noticed some tightness in Lt ribs near shoulder with diaphramatic breathing.    How long can you walk comfortably? winded walking to the mailbox    Patient Stated Goals not sure - would like to feel back to normal    Currently in Pain? No/denies              [facility] PT Assessment - 08/11/20 0001      Assessment   Medical Diagnosis Deconditioning, long haul covid 19, dyspnea and  fatigue    Referring Provider (PT) Angus Seller NP    Onset Date/Surgical Date 06/15/20    Next MD Visit 08/15/2019    Prior Therapy none            [facility] Adult PT Treatment/Exercise - 08/11/20 0001      Knee/Hip Exercises: Stretches   Hip Flexor Stretch Right;Left;2 reps;20 seconds   2nd set with arm overhead     Knee/Hip Exercises: Aerobic   Recumbent Bike 8 min:  3 min warm up L1, then up to L3 for 1 min and returning to L1 for recovery.      Knee/Hip Exercises: Standing   Forward Step Up Right;Left;1 set;10 reps;Step Height: 8";Hand Hold: 1      Knee/Hip Exercises: Seated   Sit to Sand 10 reps;without UE support      Shoulder Exercises: Supine   Other Supine Exercises diaphragmatic breathing - with cues to slow breathing; trial of 4 sec in, hold 4 sec, out for 4 sec x 10 cycles.      Shoulder Exercises: Seated   Other Seated Exercises scap retraction x 5 sec x 5 reps; W's x 5 sec x 5 reps      Shoulder Exercises: Stretch   Star Gazer Stretch 2 reps;20 seconds  Other Shoulder Stretches 3 position doorway stretch x 15 sec each position    Other Shoulder Stretches thoracic ext over back of chair x 5 sec x 3 reps                       PT Long Term Goals - 08/06/20 1251      PT LONG TERM GOAL #1   Title I with advanced HEP to include walking program and return to gym    Time 4    Period Weeks    Status New    Target Date 09/03/20      PT LONG TERM GOAL #2   Title complete 1800 feet in  6 min walking test with no more than 3/10 on the exertion scale    Time 4    Period Weeks    Status New    Target Date 09/03/20      PT LONG TERM GOAL #3   Title demo monitoring vitals during areobic activities  and verbalize safe measures for his age    Time 4    Period Weeks    Status New    Target Date 09/03/20      PT LONG TERM GOAL #4   Title perform 10 reps sit to stand with ease and no SOB    Time 4    Period Weeks    Status New    Target Date  09/03/20                 Plan - 08/11/20 1159    Clinical Impression Statement Pt reported modified dyspnea of 4/10 with L3 on bicycle, 0.5 with L1.  Pulse was 90 bpm, 98% spO2 after 10 sit to/from stands. Session focused on postural strengthening and generalized stretches.  Encouraged pt to continue walking program, trialing cycles of brisk walking and returning to recovery/comfortable pace.  Goals are ongoing.    Personal Factors and Comorbidities Comorbidity 1    Examination-Activity Limitations Stairs    Examination-Participation Restrictions Community Activity    Stability/Clinical Decision Making Stable/Uncomplicated    Rehab Potential Excellent    PT Frequency 2x / week    PT Duration 4 weeks    PT Treatment/Interventions Patient/family education;Functional mobility training;Moist Heat;Therapeutic exercise;Therapeutic activities;Neuromuscular re-education;Manual techniques    PT Next Visit Plan work on endurance, exercise tolerance.  issue new copy of exercises (left without handout)    Consulted and Agree with Plan of Care Patient           Patient will benefit from skilled therapeutic intervention in order to improve the following deficits and impairments:  Difficulty walking,Decreased endurance,Decreased activity tolerance  Visit Diagnosis: Other symptoms and signs involving the musculoskeletal system     Problem List Patient Active Problem List   Diagnosis Date Noted  . COVID-19 long hauler manifesting chronic fatigue 08/03/2020  . COVID-19 long hauler manifesting chronic dyspnea 08/03/2020  . COVID-19 long hauler manifesting chronic decreased mobility and endurance 08/03/2020   Mayer Camel, PTA 08/11/20 12:41 PM  [facility] [place] [address] [place], [place], [address] Phone: [phone]   Fax:  [phone]  Name: Louis Vang MRN: [id number] Date of Birth: 1991/03/24

## 2020-08-12 ENCOUNTER — Ambulatory Visit: Payer: 59

## 2020-08-13 ENCOUNTER — Ambulatory Visit (INDEPENDENT_AMBULATORY_CARE_PROVIDER_SITE_OTHER): Payer: PRIVATE HEALTH INSURANCE | Admitting: Physical Therapy

## 2020-08-13 ENCOUNTER — Other Ambulatory Visit: Payer: Self-pay

## 2020-08-13 DIAGNOSIS — R29898 Other symptoms and signs involving the musculoskeletal system: Secondary | ICD-10-CM | POA: Diagnosis not present

## 2020-08-13 NOTE — Therapy (Signed)
[facility] [place] [address] [place], [place], [address] Phone: [phone]   Fax:  [phone]  Physical Therapy Treatment  Patient Details  Name: Louis Vang MRN: [id number] Date of Birth: 07/15/1990 Referring Provider (PT): Angus Seller NP   Encounter Date: 08/13/2020   PT End of Session - 08/13/20 1016    Visit Number 3    Number of Visits 8    Date for PT Re-Evaluation 09/03/20    PT Start Time 0934    PT Stop Time 1016    PT Time Calculation (min) 42 min    Activity Tolerance Patient tolerated treatment well    Behavior During Therapy [facility] for tasks assessed/performed           Past Medical History:  Diagnosis Date  . Healing laceration 11/07/2011   web space right hand  . 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 MYRINGOPLASTY  age 44  . MYRINGOPLASTY W/ PAPER PATCH  11/11/2011   Procedure: MYRINGOPLASTY WITH CIGARETTE PAPER;  Surgeon: Drema Halon, MD;  Location: [facility];  Service: ENT;  Laterality: Right;   fat graft  . TONSILLECTOMY    . TYMPANOPLASTY  11/11/2011   Procedure: TYMPANOPLASTY;  Surgeon: Drema Halon, MD;  Location: [facility];  Service: ENT;  Laterality: Left;  . WISDOM TOOTH EXTRACTION      There were no vitals filed for this visit.                      [facility] Adult PT Treatment/Exercise - 08/13/20 0001      Exercises   Exercises Other Exercises    Other Exercises  breath work in hooklying, very challenging for him to breath into the chest      Knee/Hip Exercises: Aerobic   Recumbent Bike 11 min total, L1x2', L3x1' alternating between the two   keeping RPMs between 55-60 at both resistance levels, O2 sats 98-99% through out, HR 69-113.     Knee/Hip Exercises: Standing   Other Standing Knee Exercises side lunges x 10 HR 124, O2 sats 98% first set, HR 90,  sats 99 after second set , rotational chops 10 eac side, Sats good, HR 117 from one side and 88bps second set      Shoulder Exercises: Supine   Other Supine Exercises on whole bolster - chest stretch arms in T and Y,10 reps each with green band, overhead pull, horizontal abduction and SASH all encorporating breathing.                       PT Long Term Goals - 08/06/20 1251      PT LONG TERM GOAL #1   Title I with advanced HEP to include walking program and return to gym    Time 4    Period Weeks    Status New    Target Date 09/03/20      PT LONG TERM GOAL #2   Title complete 1800 feet in  6 min walking test with no more than 3/10 on the exertion scale    Time 4    Period Weeks    Status New    Target Date 09/03/20      PT LONG TERM GOAL #3   Title demo monitoring vitals during areobic activities  and verbalize safe measures for his age  Time 4    Period Weeks    Status New    Target Date 09/03/20      PT LONG TERM GOAL #4   Title perform 10 reps sit to stand with ease and no SOB    Time 4    Period Weeks    Status New    Target Date 09/03/20                 Plan - 08/13/20 1158    Clinical Impression Statement [name] presented to PT today with reports of increased chest tightness.  His oxygen sats stayed 98-99% for all of tx.  His heart rate did shoot up a couple times when cardiovascular exercise was performed.  He did have a quick recovery to baseline. Worked on segmental breathing into his hands, this was very difficult for him and he would benefit from more of this.    Rehab Potential Excellent    PT Frequency 2x / week    PT Duration 4 weeks    PT Treatment/Interventions Patient/family education;Functional mobility training;Moist Heat;Therapeutic exercise;Therapeutic activities;Neuromuscular re-education;Manual techniques    PT Next Visit Plan cont to work on activity tolerance and breathing work    Financial planner with Plan of Care Patient            Patient will benefit from skilled therapeutic intervention in order to improve the following deficits and impairments:  Difficulty walking,Decreased endurance,Decreased activity tolerance  Visit Diagnosis: Other symptoms and signs involving the musculoskeletal system     Problem List Patient Active Problem List   Diagnosis Date Noted  . COVID-19 long hauler manifesting chronic fatigue 08/03/2020  . COVID-19 long hauler manifesting chronic dyspnea 08/03/2020  . COVID-19 long hauler manifesting chronic decreased mobility and endurance 08/03/2020    Roderic Scarce PT  08/13/2020, 12:00 PM  [facility] [address] [place], [place], [address] Phone: [phone]   Fax:  [phone]  Name: Shaine Newmark MRN: [id number] Date of Birth: [birth year]

## 2020-08-17 ENCOUNTER — Encounter: Payer: PRIVATE HEALTH INSURANCE | Admitting: Rehabilitative and Restorative Service Providers"

## 2020-08-20 ENCOUNTER — Ambulatory Visit (INDEPENDENT_AMBULATORY_CARE_PROVIDER_SITE_OTHER): Payer: PRIVATE HEALTH INSURANCE | Admitting: Rehabilitative and Restorative Service Providers"

## 2020-08-20 ENCOUNTER — Encounter: Payer: Self-pay | Admitting: Rehabilitative and Restorative Service Providers"

## 2020-08-20 ENCOUNTER — Other Ambulatory Visit: Payer: Self-pay

## 2020-08-20 DIAGNOSIS — R29898 Other symptoms and signs involving the musculoskeletal system: Secondary | ICD-10-CM | POA: Diagnosis not present

## 2020-08-20 NOTE — Patient Instructions (Signed)
Access Code: [email or web address]: [email or web address]: 02/24/2022Prepared by: Calvary Difranco HoltExercises  Standing Pursed Lip Breathing with Ribcage Elevation - 4-5 x daily - 10 reps  Supine Diaphragmatic Breathing - 2 x daily - 10 reps  Sit to Stand - 2 x daily - 1-3 sets - 10 reps  Forward Step Up - 1 x daily - 7 x weekly - 2 sets - 10 reps  Doorway Pec Stretch at 90 Degrees Abduction - 1 x daily - 7 x weekly - 1 sets - 2-3 reps - 15-30 seconds hold  Seated Hip Flexor Stretch - 1 x daily - 7 x weekly - 1 sets - 2-3 reps - 15-30 seconds hold  Seated Thoracic Lumbar Extension with Pectoralis Stretch - 1 x daily - 7 x weekly - 1 sets - 2 reps - 10 seconds hold  Supine Chest Stretch with Elbows Bent - 1 x daily - 7 x weekly - 1 sets - 2 reps - 30 seconds hold  Doorway Pec Stretch at 60 Degrees Abduction - 3 x daily - 7 x weekly - 3 reps - 1 sets  Doorway Pec Stretch at 90 Degrees Abduction - 3 x daily - 7 x weekly - 3 reps - 1 sets - 30 seconds hold  Doorway Pec Stretch at 120 Degrees Abduction - 3 x daily - 7 x weekly - 3 reps - 1 sets - 30 second hold hold  Supine Lower Trunk Rotation - 2 x daily - 7 x weekly - 1 sets - 3-5 reps - 20-30 sec hold  Seated Scapular Retraction - 2 x daily - 7 x weekly - 1-2 sets - 10 reps - 10 sec hold  Seated Scapular Retraction with External Rotation - 2 x daily - 7 x weekly - 1 sets - 10 reps - 3 sec hold

## 2020-08-20 NOTE — Therapy (Signed)
[facility] [place] [address] [place], [place], [address] Phone: [phone]   Fax:  [phone]  Physical Therapy Treatment  Patient Details  Name: Louis Vang MRN: [id number] Date of Birth: 30-May-1991 Referring Provider (PT): Angus Seller NP   Encounter Date: 08/20/2020   PT End of Session - 08/20/20 1625    Visit Number 4    Number of Visits 8    Date for PT Re-Evaluation 09/03/20    PT Start Time 1622    PT Stop Time 1700    PT Time Calculation (min) 38 min    Activity Tolerance Patient tolerated treatment well           Past Medical History:  Diagnosis Date  . Healing laceration 11/07/2011   web space right hand  . 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 MYRINGOPLASTY  age 53  . MYRINGOPLASTY W/ PAPER PATCH  11/11/2011   Procedure: MYRINGOPLASTY WITH CIGARETTE PAPER;  Surgeon: Drema Halon, MD;  Location: [facility];  Service: ENT;  Laterality: Right;   fat graft  . TONSILLECTOMY    . TYMPANOPLASTY  11/11/2011   Procedure: TYMPANOPLASTY;  Surgeon: Drema Halon, MD;  Location: [facility];  Service: ENT;  Laterality: Left;  . WISDOM TOOTH EXTRACTION      There were no vitals filed for this visit.   Subjective Assessment - 08/20/20 1626    Subjective Patient reports that he is now up to two 17 min walks each day. Breathing is getting better. He moved furniture yesterday and is sore and fatigued today.    Currently in Pain? No/denies                             [facility] Adult PT Treatment/Exercise - 08/20/20 0001      Knee/Hip Exercises: Stretches   Other Knee/Hip Stretches trunk rotation 20 sec x 3 each side      Knee/Hip Exercises: Aerobic   Recumbent Bike 11 min total, L1x2', L3x1' alternating between the two   keeping RPMs between 55-60 at both resistance levels, O2 sats  98-99% through out, HR 69-113.     Shoulder Exercises: Standing   Retraction Strengthening;Both;10 reps   standing with noodle     Shoulder Exercises: Stretch   Wall Stretch - Flexion 1 rep;30 seconds   hands on doorway overhead   Other Shoulder Stretches doorway stretch 3 positions 20-30 sec hold x 2 reps each position      Manual Therapy   Manual therapy comments facilitated deep diaphragmatic breathing at ribs                  PT Education - 08/20/20 1633    Education Details reviewed HEP;    Person(s) Educated Patient    Methods Explanation    Comprehension Verbalized understanding               PT Long Term Goals - 08/06/20 1251      PT LONG TERM GOAL #1   Title I with advanced HEP to include walking program and return to gym    Time 4    Period Weeks    Status New    Target Date 09/03/20      PT LONG TERM GOAL #2   Title complete 1800 feet in  6 min walking test  with no more than 3/10 on the exertion scale    Time 4    Period Weeks    Status New    Target Date 09/03/20      PT LONG TERM GOAL #3   Title demo monitoring vitals during areobic activities  and verbalize safe measures for his age    Time 4    Period Weeks    Status New    Target Date 09/03/20      PT LONG TERM GOAL #4   Title perform 10 reps sit to stand with ease and no SOB    Time 4    Period Weeks    Status New    Target Date 09/03/20                 Plan - 08/20/20 1629    Clinical Impression Statement [name] reports increased endurance for walking at home but more fatigue today related to moving furniture at home yesterday. No increase in exercise in clinic today due to fatigue. Patient encouraged to modify his exercise at home as needed until he returns to baseline and notes improved exercise tolerance again.    Rehab Potential Excellent    PT Frequency 2x / week    PT Duration 4 weeks    PT Treatment/Interventions Patient/family education;Functional mobility  training;Moist Heat;Therapeutic exercise;Therapeutic activities;Neuromuscular re-education;Manual techniques    PT Next Visit Plan cont to work on activity tolerance and breathing work    PT Home Exercise Plan [phone]    Consulted and Agree with Plan of Care Patient           Patient will benefit from skilled therapeutic intervention in order to improve the following deficits and impairments:     Visit Diagnosis: Other symptoms and signs involving the musculoskeletal system     Problem List Patient Active Problem List   Diagnosis Date Noted  . COVID-19 long hauler manifesting chronic fatigue 08/03/2020  . COVID-19 long hauler manifesting chronic dyspnea 08/03/2020  . COVID-19 long hauler manifesting chronic decreased mobility and endurance 08/03/2020    Cassandra Mcmanaman Rober Minion  PT, MPH  08/20/2020, 5:10 PM  [facility] [address] [place], [place], [address] Phone: [phone]   Fax:  [phone]  Name: Brach Birdsall MRN: [id number] Date of Birth: 1990-11-17

## 2020-08-24 ENCOUNTER — Ambulatory Visit (INDEPENDENT_AMBULATORY_CARE_PROVIDER_SITE_OTHER): Payer: PRIVATE HEALTH INSURANCE | Admitting: Otolaryngology

## 2020-08-25 ENCOUNTER — Ambulatory Visit: Payer: PRIVATE HEALTH INSURANCE

## 2020-08-26 ENCOUNTER — Encounter: Payer: Self-pay | Admitting: Rehabilitative and Restorative Service Providers"

## 2020-08-26 ENCOUNTER — Other Ambulatory Visit: Payer: Self-pay

## 2020-08-26 ENCOUNTER — Ambulatory Visit (INDEPENDENT_AMBULATORY_CARE_PROVIDER_SITE_OTHER): Payer: PRIVATE HEALTH INSURANCE | Admitting: Rehabilitative and Restorative Service Providers"

## 2020-08-26 DIAGNOSIS — R29898 Other symptoms and signs involving the musculoskeletal system: Secondary | ICD-10-CM

## 2020-08-26 NOTE — Therapy (Addendum)
[place], [place], [address] Phone: [phone]   Fax:  [phone]  Physical Therapy Treatment  Patient Details  Name: Louis Vang MRN: [id number] Date of Birth: 1991/01/12 Referring Provider (PT): Lazaro Arms NP   Encounter Date: 08/26/2020    Past Medical History:  Diagnosis Date  . Healing laceration 11/07/2011   web space right hand  . 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 MYRINGOPLASTY  age 75  . MYRINGOPLASTY W/ PAPER PATCH  11/11/2011   Procedure: MYRINGOPLASTY WITH CIGARETTE PAPER;  Surgeon: Rozetta Nunnery, MD;  Location: [place];  Service: ENT;  Laterality: Right;   fat graft  . TONSILLECTOMY    . TYMPANOPLASTY  11/11/2011   Procedure: TYMPANOPLASTY;  Surgeon: Rozetta Nunnery, MD;  Location: [place];  Service: ENT;  Laterality: Left;  . WISDOM TOOTH EXTRACTION      There were no vitals filed for this visit.                                   PT Long Term Goals - 08/26/20 1706      PT LONG TERM GOAL #1   Title I with advanced HEP to include walking program and return to gym    Time 4    Period Weeks    Status Achieved      PT LONG TERM GOAL #2   Title complete 1800 feet in  6 min walking test with no more than 3/10 on the exertion scale    Time 4    Period Weeks    Status Unable to assess      PT LONG TERM GOAL #3   Title demo monitoring vitals during areobic activities  and verbalize safe measures for his age    Time 4    Period Weeks    Status New      PT LONG TERM GOAL #4   Title perform 10 reps sit to stand with ease and no SOB    Time 4    Period Weeks    Status Achieved                  Patient will benefit from skilled therapeutic intervention in order to improve the following deficits and impairments:      Visit Diagnosis: Other symptoms and signs involving the musculoskeletal system     Problem List Patient Active Problem List   Diagnosis Date Noted  . COVID-19 long hauler manifesting chronic fatigue 08/03/2020  . COVID-19 long hauler manifesting chronic dyspnea 08/03/2020  . COVID-19 long hauler manifesting chronic decreased mobility and endurance 08/03/2020    Asharia Lotter Nilda Simmer PT, MPH  09/16/2020, 12:46 PM  [facility] [place], [place], [address] Phone: [phone]   Fax:  [phone]  Name: [name] MRN: [id number] Date of Birth: 02/11/1991  PHYSICAL THERAPY DISCHARGE SUMMARY  Visits from Start of Care: 5  Current functional level related to goals / functional outcomes: See progress note for discharge status    Remaining deficits: Continued decreased endurance.    Education / Equipment: HEP  Plan: Patient agrees to discharge.  Patient goals were met. Patient is being discharged due to meeting the stated rehab goals.  ?????     [name]  Nilda Simmer PT, MPH 09/16/20 12:46 PM

## 2020-08-26 NOTE — Patient Instructions (Addendum)
   Access Code: [email or web address]: [email or web address]: 03/02/2022Prepared by: Orlondo Holycross HoltExercises  Standing Pursed Lip Breathing with Ribcage Elevation - 4-5 x daily - 10 reps  Supine Diaphragmatic Breathing - 2 x daily - 10 reps  Sit to Stand - 2 x daily - 1-3 sets - 10 reps  Forward Step Up - 1 x daily - 7 x weekly - 2 sets - 10 reps  Doorway Pec Stretch at 90 Degrees Abduction - 1 x daily - 7 x weekly - 1 sets - 2-3 reps - 15-30 seconds hold  Seated Hip Flexor Stretch - 1 x daily - 7 x weekly - 1 sets - 2-3 reps - 15-30 seconds hold  Seated Thoracic Lumbar Extension with Pectoralis Stretch - 1 x daily - 7 x weekly - 1 sets - 2 reps - 10 seconds hold  Supine Chest Stretch with Elbows Bent - 1 x daily - 7 x weekly - 1 sets - 2 reps - 30 seconds hold  Doorway Pec Stretch at 60 Degrees Abduction - 3 x daily - 7 x weekly - 3 reps - 1 sets  Doorway Pec Stretch at 90 Degrees Abduction - 3 x daily - 7 x weekly - 3 reps - 1 sets - 30 seconds hold  Doorway Pec Stretch at 120 Degrees Abduction - 3 x daily - 7 x weekly - 3 reps - 1 sets - 30 second hold hold  Supine Lower Trunk Rotation - 2 x daily - 7 x weekly - 1 sets - 3-5 reps - 20-30 sec hold  Seated Scapular Retraction - 2 x daily - 7 x weekly - 1-2 sets - 10 reps - 10 sec hold  Seated Scapular Retraction with External Rotation - 2 x daily - 7 x weekly - 1 sets - 10 reps - 3 sec hold  Prone Scapular Retraction - 2 x daily - 7 x weekly - 1 sets - 5-10 reps - 3-5 sec hold  Prone Scapular Retraction Arms at Side - 2 x daily - 7 x weekly - 1 sets - 5-10 reps - 3-5 sec hold  Prone W Scapular Retraction - 2 x daily - 7 x weekly - 1 sets - 5-10 reps - 3-5 sec hold  Prone Scapular Retraction Y - 2 x daily - 7 x weekly - 1 sets - 3-5 reps - 5-10 sec hold  Prone Scapular Retraction in Flexion - 2 x daily - 7 x weekly - 1 sets - 5-10 reps - 3-5 sec hold  Prone Quadriceps Stretch with Strap - 2 x daily - 7 x weekly - 1 sets - 3 reps - 30 sec  hold  Hooklying Hamstring Stretch with Strap - 2 x daily - 7 x weekly - 1 sets - 3 reps - 30 sec hold  Supine ITB Stretch with Strap - 2 x daily - 7 x weekly - 1 sets - 3 reps - 30 sec hold  Supine Piriformis Stretch with Leg Straight - 2 x daily - 7 x weekly - 1 sets - 3 reps - 30 sec hold

## 2020-08-27 ENCOUNTER — Ambulatory Visit (INDEPENDENT_AMBULATORY_CARE_PROVIDER_SITE_OTHER): Payer: PRIVATE HEALTH INSURANCE | Admitting: Nurse Practitioner

## 2020-08-27 VITALS — BP 107/80 | HR 77 | Temp 98.0°F | Ht 70.0 in | Wt 172.0 lb

## 2020-08-27 DIAGNOSIS — Z8619 Personal history of other infectious and parasitic diseases: Secondary | ICD-10-CM

## 2020-08-27 DIAGNOSIS — Z8616 Personal history of COVID-19: Secondary | ICD-10-CM

## 2020-08-27 DIAGNOSIS — R5382 Chronic fatigue, unspecified: Secondary | ICD-10-CM

## 2020-08-27 DIAGNOSIS — K529 Noninfective gastroenteritis and colitis, unspecified: Secondary | ICD-10-CM

## 2020-08-27 DIAGNOSIS — U099 Post covid-19 condition, unspecified: Secondary | ICD-10-CM

## 2020-08-27 DIAGNOSIS — R0683 Snoring: Secondary | ICD-10-CM

## 2020-08-27 NOTE — Progress Notes (Signed)
@Patient  ID: , male    DOB: 01-May-1991, 30 y.o.   MRN: [id number]  Chief Complaint  Patient presents with  . Fatigue    Comes and goes. Having some GI issues     Referring provider: [id number], MD   30 year old male with no significant health history.  HPI  Patient presents today for post COVID care clinic visit.  This is a follow-up visit.  Patient was last seen in the Covid clinic on 07/30/2020.  He tested positive for Covid on 06/15/2020.  He states that he has been stable.  He has started seeing physical therapy.  He states that this is helping.  He does complain today of ongoing GI issues including diarrhea that is intermittent.  Patient is also concerned about sleep apnea because he states that he snores at night.  We discussed that we can place a referral to GI for ongoing bowel issues after Covid and placed a referral to pulmonary for possible sleep study.  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 Denies f/c/s, n/v/d, hemoptysis, PND, chest pain or edema.     Allergies  Allergen Reactions  . Antihistamines, Diphenhydramine-Type   . Hydroxyzine Hcl Other (See Comments)    Numbness and tingling in extremities    Immunization History  Administered Date(s) Administered  . Moderna Sars-Covid-2 Vaccination 09/20/2019, 10/23/2019    Past Medical History:  Diagnosis Date  . Healing laceration 11/07/2011   web space right hand  . 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 Years: 2.00  . Types: Cigarettes  Smokeless Tobacco Never Used  Tobacco Comment   5 cig./week   Counseling given: Not Answered Comment: 5 cig./week   Outpatient Encounter Medications as of 08/27/2020  Medication Sig  . [DISCONTINUED] hydrOXYzine (ATARAX/VISTARIL) 25 MG tablet Take 1-2 tablets every 6  hours as needed for anxiety. (Patient not taking: Reported on 08/06/2020)  . [DISCONTINUED] ibuprofen (ADVIL,MOTRIN) 200 MG tablet Take 400 mg by mouth every 6 (six) hours as needed for headache or moderate pain. (Patient not taking: Reported on 08/06/2020)  . [DISCONTINUED] lisdexamfetamine (VYVANSE) 30 MG capsule Take 30 mg by mouth daily. (Patient not taking: Reported on 08/06/2020)  . [DISCONTINUED] Multiple Vitamin (MULTIVITAMIN WITH MINERALS) TABS tablet Take 1 tablet by mouth every morning.   No facility-administered encounter medications on file as of 08/27/2020.     Review of Systems  Review of Systems  Constitutional: Positive for fatigue.  HENT: Negative.   Respiratory: Negative for cough and shortness of breath.   Cardiovascular: Negative.  Negative for chest pain, palpitations and leg swelling.  Gastrointestinal: Positive for diarrhea.  Allergic/Immunologic: Negative.   Neurological: Negative.   Psychiatric/Behavioral: Negative.        Physical Exam  BP 107/80   Pulse 77   Temp 98 F (36.7 C)   Ht 5\' 10"  (1.778 m)   Wt 172 lb (78 kg)   SpO2 98%   BMI 24.68 kg/m   Wt Readings from Last 5 Encounters:  08/27/20 172 lb (78 kg)  05/22/18 164 lb (74.4 kg)  05/17/18 165 lb (74.8 kg)  02/03/18 158 lb 12 oz (72 kg)  11/07/11 138 lb (62.6 kg)     Physical Exam Vitals and nursing note reviewed.  Constitutional:      General: He is  not in acute distress.    Appearance: He is well-developed.  Cardiovascular:     Rate and Rhythm: Normal rate and regular rhythm.  Pulmonary:     Effort: Pulmonary effort is normal.     Breath sounds: Normal breath sounds.  Musculoskeletal:     Right lower leg: No edema.     Left lower leg: Edema present.  Skin:    General: Skin is warm and dry.  Neurological:     Mental Status: He is alert and oriented to person, place, and time.  Psychiatric:        Mood and Affect: Mood normal.        Behavior: Behavior normal.       Imaging: No results found.   Assessment & Plan:   History of COVID-19 Diarrhea fatigue:   Stay well hydrated  Stay active  Deep breathing exercises  May take tylenol or fever or pain  Will place referral per patient request to ID - history of lymes and mono  Will place referral GI - chronic diarrhea and GERD following covid  Will place referral to pulmonary per patient request for sleep study - patient states that he snores at night    Follow up:  Follow up if needed      Ivonne Andrew, NP 09/23/2020

## 2020-08-27 NOTE — Patient Instructions (Addendum)
History of Covid 19 Diarrhea fatigue:   Stay well hydrated  Stay active  Deep breathing exercises  May take tylenol or fever or pain  Will place referral per patient request to ID - history of lymes and mono  Will place referral GI - chronic diarrhea and GERD following covid  Will place referral to pulmonary per patient request for sleep study - patient states that he snores at night    Follow up:  Follow up if needed

## 2020-08-28 ENCOUNTER — Encounter: Payer: PRIVATE HEALTH INSURANCE | Admitting: Rehabilitative and Restorative Service Providers"

## 2020-09-02 ENCOUNTER — Ambulatory Visit: Payer: PRIVATE HEALTH INSURANCE | Admitting: Infectious Diseases

## 2020-09-10 ENCOUNTER — Encounter (INDEPENDENT_AMBULATORY_CARE_PROVIDER_SITE_OTHER): Payer: Self-pay | Admitting: Otolaryngology

## 2020-09-10 ENCOUNTER — Ambulatory Visit (INDEPENDENT_AMBULATORY_CARE_PROVIDER_SITE_OTHER): Payer: PRIVATE HEALTH INSURANCE | Admitting: Otolaryngology

## 2020-09-10 ENCOUNTER — Other Ambulatory Visit: Payer: Self-pay

## 2020-09-10 VITALS — Temp 98.1°F

## 2020-09-10 DIAGNOSIS — H906 Mixed conductive and sensorineural hearing loss, bilateral: Secondary | ICD-10-CM

## 2020-09-10 DIAGNOSIS — H7292 Unspecified perforation of tympanic membrane, left ear: Secondary | ICD-10-CM | POA: Diagnosis not present

## 2020-09-10 NOTE — Progress Notes (Signed)
HPI: Louis Vang is a 30 y.o. male who presents is referred by his PCP for evaluation of hearing aids.  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  I had performed a left tympanoplasty and a right fat graft myringoplasty in 2013 because of chronic perforations.  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  He has had no drainage from the ear. He brings with him and audiogram performed on 09/09/2020 that showed downsloping mild mixed hearing loss in the right ear and a larger conductive hearing loss in the left ear of approximately 30-40 dB in the lower frequencies.  And minimal conductive loss in the upper frequencies.  SRT's were 35 DB on the right and 45 DB on the left.  Past Medical History:  Diagnosis Date  . Healing laceration 11/07/2011   web space right hand  . 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 MYRINGOPLASTY  age 62  . MYRINGOPLASTY W/ PAPER PATCH  11/11/2011   Procedure: MYRINGOPLASTY WITH CIGARETTE PAPER;  Surgeon: Drema Halon, MD;  Location: [facility];  Service: ENT;  Laterality: Right;   fat graft  . TONSILLECTOMY    . TYMPANOPLASTY  11/11/2011   Procedure: TYMPANOPLASTY;  Surgeon: Drema Halon, MD;  Location: [facility];  Service: ENT;  Laterality: Left;  . WISDOM TOOTH EXTRACTION     Social History   Socioeconomic History  . Marital status: Married    Spouse name: Not on file  . Number of children: Not on file  . Years of education: Not on file  . Highest education level: Not on file  Occupational History  . Not on file  Tobacco Use  . Smoking status: Former Smoker    Years: 2.00    Types: Cigarettes  . Smokeless tobacco: Never Used  . Tobacco comment: 5  cig./week  Substance and Sexual Activity  . Alcohol use: Yes    Comment: 2-3 x/week  . Drug use: No  . Sexual activity: Not on file  Other Topics Concern  . Not on file  Social History Narrative  . Not on file   Social Determinants of Health   Financial Resource Strain: Not on file  Food Insecurity: Not on file  Transportation Needs: Not on file  Physical Activity: Not on file  Stress: Not on file  Social Connections: Not on file   No family history on file. Allergies  Allergen Reactions  . Antihistamines, Diphenhydramine-Type   . Hydroxyzine Hcl Other (See Comments)    Numbness and tingling in extremities   Prior to Admission medications   Not on File     Positive ROS: Otherwise negative  All other systems have been reviewed and were otherwise negative with the exception of those mentioned in the HPI and as above.  Physical Exam: Constitutional: Alert, well-appearing, no acute distress Ears: External ears without lesions or tenderness.  He had wax buildup on the right side that was removed in the office.  I can only visualize the posterior half of the TM as he has a small ear canal with a large bony canal wall bulge.  No obvious TM perforation noted.  TM appeared intact.  On the left side he has a large posterior  superior TM retraction pocket with the TM draped over the incus long process with some erosion of the long process of the incus.  He has an anterior TM perforation which is chronic and dry. Nasal: External nose without lesions. Septum with mild deformity and mild rhinitis.. Clear nasal passages otherwise with no signs of infection. Oral: Lips and gums without lesions. Tongue and palate mucosa without lesions. Posterior oropharynx clear. Neck: No palpable adenopathy or masses Respiratory: Breathing comfortably  Skin: No facial/neck lesions or rash noted.  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  Plan: Patient is cleared to get hearing aids. Briefly discussed with him concerning possible surgical options on the left side to perform tympanoplasty and ossicular reconstruction which would probably improve his hearing on the left side to equal the right side.  But he would probably still require hearing aids to hear adequately.   Narda Bonds, MD   CC:

## 2020-09-22 ENCOUNTER — Ambulatory Visit (INDEPENDENT_AMBULATORY_CARE_PROVIDER_SITE_OTHER): Payer: PRIVATE HEALTH INSURANCE | Admitting: Licensed Clinical Social Worker

## 2020-09-22 DIAGNOSIS — F4321 Adjustment disorder with depressed mood: Secondary | ICD-10-CM | POA: Diagnosis not present

## 2020-09-22 DIAGNOSIS — F411 Generalized anxiety disorder: Secondary | ICD-10-CM

## 2020-09-23 DIAGNOSIS — K529 Noninfective gastroenteritis and colitis, unspecified: Secondary | ICD-10-CM | POA: Insufficient documentation

## 2020-09-23 DIAGNOSIS — Z8619 Personal history of other infectious and parasitic diseases: Secondary | ICD-10-CM | POA: Insufficient documentation

## 2020-09-23 DIAGNOSIS — R0683 Snoring: Secondary | ICD-10-CM | POA: Insufficient documentation

## 2020-09-23 DIAGNOSIS — Z8616 Personal history of COVID-19: Secondary | ICD-10-CM | POA: Insufficient documentation
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
## 2020-09-23 NOTE — Assessment & Plan Note (Signed)
Diarrhea fatigue:   Stay well hydrated  Stay active  Deep breathing exercises  May take tylenol or fever or pain  Will place referral per patient request to ID - history of lymes and mono  Will place referral GI - chronic diarrhea and GERD following covid  Will place referral to pulmonary per patient request for sleep study - patient states that he snores at night    Follow up:  Follow up if needed

## 2020-10-19 ENCOUNTER — Other Ambulatory Visit: Payer: Self-pay

## 2020-10-19 ENCOUNTER — Ambulatory Visit (INDEPENDENT_AMBULATORY_CARE_PROVIDER_SITE_OTHER): Payer: PRIVATE HEALTH INSURANCE | Admitting: Nurse Practitioner

## 2020-10-19 ENCOUNTER — Encounter: Payer: Self-pay | Admitting: Nurse Practitioner

## 2020-10-19 VITALS — BP 119/78 | HR 89 | Resp 16 | Wt 174.0 lb

## 2020-10-19 DIAGNOSIS — U099 Post covid-19 condition, unspecified: Secondary | ICD-10-CM

## 2020-10-19 DIAGNOSIS — F419 Anxiety disorder, unspecified: Secondary | ICD-10-CM | POA: Diagnosis not present

## 2020-10-19 DIAGNOSIS — M791 Myalgia, unspecified site: Secondary | ICD-10-CM | POA: Diagnosis not present

## 2020-10-19 DIAGNOSIS — Z8616 Personal history of COVID-19: Secondary | ICD-10-CM

## 2020-10-19 DIAGNOSIS — R5382 Chronic fatigue, unspecified: Secondary | ICD-10-CM | POA: Diagnosis not present

## 2020-10-19 DIAGNOSIS — Z7409 Other reduced mobility: Secondary | ICD-10-CM

## 2020-10-19 MED ORDER — ESCITALOPRAM OXALATE 10 MG PO TABS: 10.0000 mg | ORAL_TABLET | Freq: Every day | ORAL | 0 refills | Status: DC

## 2020-10-19 MED ORDER — PREDNISONE 20 MG PO TABS: 20.0000 mg | ORAL_TABLET | Freq: Every day | ORAL | 0 refills | Status: AC

## 2020-10-19 NOTE — Patient Instructions (Signed)
History of Covid 19 Fatigue Anxiety Depression Tremor Muscle weakness and pain:   Stay well hydrated - very important  Stay active  Deep breathing exercises  May take tylenol or fever or pain  Please keep upcoming appointment with pulmonary - sleep eval  Consider reaching out to restart PT  Will order lexapro  Will order prednisone  Will check labs   Follow up:  Follow up if needed

## 2020-10-19 NOTE — Progress Notes (Signed)
@Patient  ID: , male    DOB: Feb 09, 1991, 30 y.o.   MRN: [id number]  Chief Complaint  Patient presents with  . Covid Positive    Dec 21    Referring provider: Dec 23, MD   HPI  Patient presents for post-COVID care clinic visit.  This is a follow-up visit.  Patient was last seen in our office on 08/28/2018.  He was referred to GI and infectious disease per his request.  He did not go to either of these referrals.  Patient has been following with PT.  He states that it was really helping him but after he was discharged from their care his extreme fatigue returned.  Patient does have an appointment set up with pulmonary for sleep study evaluation.  Patient is having ongoing issues with anxiety and depression after COVID.  His PCP prescribed Zoloft but he stated that he had a adverse reaction to this medication.  Patient brings a long list of symptoms that he has typed out to the visit today.  Most of the symptoms are centered around fatigue and anxiety.  He does state that he is having weak muscles and muscle spasms. Denies f/c/s, n/v/d, hemoptysis, PND, chest pain or edema.      Allergies  Allergen Reactions  . Antihistamines, Diphenhydramine-Type   . Hydroxyzine Hcl Other (See Comments)    Numbness and tingling in extremities    Immunization History  Administered Date(s) Administered  . Moderna Sars-Covid-2 Vaccination 09/20/2019, 10/23/2019    Past Medical History:  Diagnosis Date  . Healing laceration 11/07/2011   web space right hand  . 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 Years: 2.00  . Types: Cigarettes  Smokeless Tobacco Never Used  Tobacco Comment   5 cig./week   Counseling given: Yes Comment: 5 cig./week   Outpatient Encounter Medications as of 10/19/2020  Medication Sig  . escitalopram (LEXAPRO) 10 MG tablet Take 1 tablet (10 mg total) by mouth  daily.  . famotidine (PEPCID) 20 MG tablet Take 20 mg by mouth 2 (two) times daily.  . predniSONE (DELTASONE) 20 MG tablet Take 1 tablet (20 mg total) by mouth daily with breakfast for 5 days.  10/21/2020 VYVANSE 10 MG capsule Take 10 mg by mouth every morning.   No facility-administered encounter medications on file as of 10/19/2020.     Review of Systems  Review of Systems  Constitutional: Positive for activity change, appetite change and fatigue. Negative for fever.  HENT: Negative.   Respiratory: Positive for shortness of breath. Negative for cough.   Cardiovascular: Negative.  Negative for chest pain, palpitations and leg swelling.  Gastrointestinal: Negative.   Musculoskeletal: Positive for myalgias.  Allergic/Immunologic: Negative.   Neurological: Positive for tremors.  Psychiatric/Behavioral: The patient is nervous/anxious.        Physical Exam  BP 119/78   Pulse 89   Resp 16   Wt 174 lb (78.9 kg)   SpO2 96%   BMI 24.97 kg/m   Wt Readings from Last 5 Encounters:  10/19/20 174 lb (78.9 kg)  08/27/20 172 lb (78 kg)  05/22/18 164 lb (74.4 kg)  05/17/18 165 lb (74.8 kg)  02/03/18 158 lb 12 oz (72 kg)     Physical Exam Vitals and nursing note reviewed.  Constitutional:      General: He is not in acute distress.    Appearance: He is well-developed.  Cardiovascular:     Rate and Rhythm: Normal rate and regular rhythm.  Pulmonary:     Effort: Pulmonary effort is normal.     Breath sounds: Normal breath sounds.  Skin:    General: Skin is warm and dry.  Neurological:     Mental Status: He is alert and oriented to person, place, and time.  Psychiatric:        Mood and Affect: Mood normal.        Behavior: Behavior normal.       Assessment & Plan:   History of COVID-19 Fatigue Anxiety Depression Tremor Muscle weakness and pain:   Stay well hydrated - very important  Stay active  Deep breathing exercises  May take tylenol or fever or  pain  Please keep upcoming appointment with pulmonary - sleep eval  Consider reaching out to restart PT  Will order lexapro  Will order prednisone  Will check labs   Follow up:  Follow up if needed      Ivonne Andrew, NP 10/20/2020

## 2020-10-20 ENCOUNTER — Encounter: Payer: Self-pay | Admitting: Nurse Practitioner

## 2020-10-20 LAB — COMPREHENSIVE METABOLIC PANEL
ALT: 10 IU/L (ref 0–44)
AST: 12 IU/L (ref 0–40)
Albumin/Globulin Ratio: 1.7 (ref 1.2–2.2)
Albumin: 5 g/dL (ref 4.1–5.2)
Alkaline Phosphatase: 90 IU/L (ref 44–121)
BUN/Creatinine Ratio: 13 (ref 9–20)
BUN: 12 mg/dL (ref 6–20)
Bilirubin Total: 0.4 mg/dL (ref 0.0–1.2)
CO2: 20 mmol/L (ref 20–29)
Calcium: 9.8 mg/dL (ref 8.7–10.2)
Chloride: 101 mmol/L (ref 96–106)
Creatinine, Ser: 0.92 mg/dL (ref 0.76–1.27)
Globulin, Total: 3 g/dL (ref 1.5–4.5)
Glucose: 95 mg/dL (ref 65–99)
Potassium: 4.2 mmol/L (ref 3.5–5.2)
Sodium: 137 mmol/L (ref 134–144)
Total Protein: 8 g/dL (ref 6.0–8.5)
eGFR: 115 mL/min/{1.73_m2} (ref >59)

## 2020-10-20 LAB — CBC
Hematocrit: 44.1 % (ref 37.5–51.0)
Hemoglobin: 15.6 g/dL (ref 13.0–17.7)
MCH: 30.4 pg (ref 26.6–33.0)
MCHC: 35.4 g/dL (ref 31.5–35.7)
MCV: 86 fL (ref 79–97)
Platelets: 351 10*3/uL (ref 150–450)
RBC: 5.14 x10E6/uL (ref 4.14–5.80)
RDW: 12.2 % (ref 11.6–15.4)
WBC: 6.9 10*3/uL (ref 3.4–10.8)

## 2020-10-20 LAB — VITAMIN D 25 HYDROXY (VIT D DEFICIENCY, FRACTURES): Vit D, 25-Hydroxy: 33.2 ng/mL (ref 30.0–100.0)

## 2020-10-20 LAB — ANA: Anti Nuclear Antibody (ANA): NEGATIVE

## 2020-10-20 LAB — RHEUMATOID FACTOR: Rheumatoid fact SerPl-aCnc: <10 IU/mL (ref <14.0)

## 2020-10-20 LAB — TSH: TSH: 1.13 u[IU]/mL (ref 0.450–4.500)

## 2020-10-20 LAB — C-REACTIVE PROTEIN: CRP: <1 mg/L (ref 0–10)

## 2020-10-20 LAB — SEDIMENTATION RATE: Sed Rate: 12 mm/hr (ref 0–15)

## 2020-10-20 LAB — VITAMIN B12: Vitamin B-12: 490 pg/mL (ref 232–1245)

## 2020-10-20 NOTE — Assessment & Plan Note (Signed)
Fatigue Anxiety Depression Tremor Muscle weakness and pain:   Stay well hydrated - very important  Stay active  Deep breathing exercises  May take tylenol or fever or pain  Please keep upcoming appointment with pulmonary - sleep eval  Consider reaching out to restart PT  Will order lexapro  Will order prednisone  Will check labs   Follow up:  Follow up if needed

## 2020-10-26 ENCOUNTER — Telehealth (INDEPENDENT_AMBULATORY_CARE_PROVIDER_SITE_OTHER): Payer: PRIVATE HEALTH INSURANCE | Admitting: Licensed Clinical Social Worker

## 2020-10-26 DIAGNOSIS — F411 Generalized anxiety disorder: Secondary | ICD-10-CM

## 2020-10-27 NOTE — Progress Notes (Signed)
Virtual Visit via Video Note  I connected with Louis Vang on 10/27/20 at  1:00 PM EDT by a video enabled telemedicine application and verified that I am speaking with the correct person using two identifiers.  Location: Patient: Home Provider: Office   I discussed the limitations of evaluation and management by telemedicine and the availability of in person appointments. The patient expressed understanding and agreed to proceed.  THERAPIST PROGRESS NOTE  Session Time: 1:00 pm-1:40 pm  Type of Therapy: Individual Therapy   Session#1  Treatment Goals addressed: Louis Vang" will manage mood and anxiety as evidenced by lowering school on PHQ-9 and GAD-7, improving sleep, managing anxious thoughts, and process past traumas for 5 out of 7 days for 60 days.  Interventions: Therapist utilized CBT and Solution focused brief therapy to address anxiety. Therapist provided support and empathy to patient as he shared his thoughts and feelings in session. Therapist had patient identify triggers for anxiety. Therapist mapped patient's nervous system response and assisted patient in identifying ways to regulate he response.  Effectiveness: Patient was oriented x5 (person, place, situation, time, and object). Patient was casually dressed, and appropriately groomed. Patient was alert, engaged, and pleasant. Patient continues to struggle with the after affects of COVID. Patient tried an edible to try to sleep and that caused him to spiral into difficulty with sleep and anxiety. Patient uses sleep meditation to fall asleep. Patient understood the typical responses of the nervous system. Patient was able to identify ways to regulate his response including music, meditation, and talking to his sister. Patient is trying to rest and reduce the amount of time he spends on google looking up symptoms.  Patient was engaged in session. He responded well to interventions. Patient continues to meet criteria for  Generalized Anxiety Disorder. Patient will continue in outpatient therapy due to being the least restrictive service to meet his needs. Patient made minimal progress on his goals.   Suicidal/Homicidal: Negativewithout intent/plan  Plan: Return again in 1-2 weeks.  Diagnosis: Axis I: Generalized Anxiety Disorder    Axis II: No diagnosis I discussed the assessment and treatment plan with the patient. The patient was provided an opportunity to ask questions and all were answered. The patient agreed with the plan and demonstrated an understanding of the instructions.   The patient was advised to call back or seek an in-person evaluation if the symptoms worsen or if the condition fails to improve as anticipated.  I provided 40 minutes of non-face-to-face time during this encounter.   [name], LCSW 10/27/2020

## 2020-11-02 ENCOUNTER — Ambulatory Visit (INDEPENDENT_AMBULATORY_CARE_PROVIDER_SITE_OTHER): Payer: PRIVATE HEALTH INSURANCE | Admitting: Nurse Practitioner

## 2020-11-02 ENCOUNTER — Ambulatory Visit (INDEPENDENT_AMBULATORY_CARE_PROVIDER_SITE_OTHER): Payer: PRIVATE HEALTH INSURANCE | Admitting: Licensed Clinical Social Worker

## 2020-11-02 VITALS — BP 119/84 | HR 104 | Temp 97.9°F | Resp 18

## 2020-11-02 DIAGNOSIS — U099 Post covid-19 condition, unspecified: Secondary | ICD-10-CM

## 2020-11-02 DIAGNOSIS — R41840 Attention and concentration deficit: Secondary | ICD-10-CM

## 2020-11-02 DIAGNOSIS — Z7409 Other reduced mobility: Secondary | ICD-10-CM | POA: Diagnosis not present

## 2020-11-02 DIAGNOSIS — F411 Generalized anxiety disorder: Secondary | ICD-10-CM

## 2020-11-02 DIAGNOSIS — Z8616 Personal history of COVID-19: Secondary | ICD-10-CM

## 2020-11-02 DIAGNOSIS — R5382 Chronic fatigue, unspecified: Secondary | ICD-10-CM | POA: Diagnosis not present

## 2020-11-02 DIAGNOSIS — G9332 Myalgic encephalomyelitis/chronic fatigue syndrome: Secondary | ICD-10-CM

## 2020-11-02 NOTE — Assessment & Plan Note (Signed)
Fatigue Memory loss:   Stay well hydrated  Stay active  Deep breathing exercises  May start vitamin C daily, vitamin D3 daily, Zinc daily  May take tylenol or fever or pain  Will order speech therapy for cognitive rehabilitation  Continue lexapro     Follow up:  Follow up in 4 weeks or sooner if needed

## 2020-11-02 NOTE — Patient Instructions (Addendum)
Covid 19 Fatigue Memory loss:   Stay well hydrated  Stay active  Deep breathing exercises  May start vitamin C daily, vitamin D3 daily, Zinc daily  May take tylenol or fever or pain  Will order speech therapy for cognitive rehabilitation  Continue lexapro     Follow up:  Follow up in 4 weeks or sooner if needed

## 2020-11-02 NOTE — Progress Notes (Signed)
@Patient  ID: , male    DOB: [birth year], 30 y.o.   MRN: [id number]  Chief Complaint  Patient presents with  . Follow-up    Referring provider: [id number], MD  HPI  Patient presents today for post-COVID care clinic visit follow-up.  Patient was last seen in our office on 10/19/2020.  He was started on Lexapro.  He has since followed with his PCP who started him on Lunesta to help with sleep.  He states that both these medications have made an improvement.  He is sleeping better and feeling much better throughout the day.  Still continues to have ongoing fatigue and memory loss.  He is asking for FMLA paperwork to be completed today for short-term disability.  He would like a reduced schedule to 50% of previous schedule while recovering.  Patient was advised to reach back out to physical therapy - he may need to restart therapy - this was helping previously. Will refer him to speech therapy for cognitive rehabilitation. Denies f/c/s, n/v/d, hemoptysis, PND, chest pain or edema.       Allergies  Allergen Reactions  . Antihistamines, Diphenhydramine-Type   . Hydroxyzine Hcl Other (See Comments)    Numbness and tingling in extremities    Immunization History  Administered Date(s) Administered  . Moderna Sars-Covid-2 Vaccination 09/20/2019, 10/23/2019    Past Medical History:  Diagnosis Date  . Healing laceration 11/07/2011   web space right hand  . 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 Years: 2.00  . Types: Cigarettes  Smokeless Tobacco Never Used  Tobacco Comment   5 cig./week   Counseling given: Yes Comment: 5 cig./week   Outpatient Encounter Medications as of 11/02/2020  Medication Sig  . escitalopram (LEXAPRO) 10 MG tablet Take 1 tablet (10 mg total) by mouth daily.  . famotidine (PEPCID) 20 MG tablet Take 20 mg by mouth 2 (two) times daily.  01/02/2021 VYVANSE 10 MG  capsule Take 10 mg by mouth every morning.   No facility-administered encounter medications on file as of 11/02/2020.     Review of Systems  Review of Systems  Constitutional: Positive for fatigue.  HENT: Negative.   Respiratory: Positive for cough and shortness of breath.   Cardiovascular: Negative.   Gastrointestinal: Negative.   Allergic/Immunologic: Negative.   Neurological: Negative.   Psychiatric/Behavioral: The patient is nervous/anxious.        Physical Exam  BP 119/84   Pulse (!) 104   Temp 97.9 F (36.6 C)   Resp 18   SpO2 98%   Wt Readings from Last 5 Encounters:  10/19/20 174 lb (78.9 kg)  08/27/20 172 lb (78 kg)  05/22/18 164 lb (74.4 kg)  05/17/18 165 lb (74.8 kg)  02/03/18 158 lb 12 oz (72 kg)     Physical Exam Vitals and nursing note reviewed.  Constitutional:      General: He is not in acute distress.    Appearance: He is well-developed.  Cardiovascular:     Rate and Rhythm: Normal rate and regular rhythm.  Pulmonary:     Effort: Pulmonary effort is normal.     Breath sounds: Normal breath sounds.  Musculoskeletal:     Right lower leg: No edema.     Left lower leg: Edema present.  Skin:    General: Skin is warm and dry.  Neurological:     Mental Status: He is alert  and oriented to person, place, and time.  Psychiatric:        Mood and Affect: Mood normal.        Behavior: Behavior normal.        Assessment & Plan:   History of COVID-19 Fatigue Memory loss:   Stay well hydrated  Stay active  Deep breathing exercises  May start vitamin C daily, vitamin D3 daily, Zinc daily  May take tylenol or fever or pain  Will order speech therapy for cognitive rehabilitation  Continue lexapro     Follow up:  Follow up in 4 weeks or sooner if needed      Ivonne Andrew, NP 11/02/2020
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
## 2020-11-09 ENCOUNTER — Ambulatory Visit (INDEPENDENT_AMBULATORY_CARE_PROVIDER_SITE_OTHER): Payer: PRIVATE HEALTH INSURANCE | Admitting: Licensed Clinical Social Worker

## 2020-11-09 DIAGNOSIS — F411 Generalized anxiety disorder: Secondary | ICD-10-CM | POA: Diagnosis not present

## 2020-11-10 NOTE — Progress Notes (Signed)
Virtual Visit via Video Note  I connected with Louis Vang on 11/10/20 at  1:00 PM EDT by a video enabled telemedicine application and verified that I am speaking with the correct person using two identifiers.  Location: Patient: Home Provider: Office   I discussed the limitations of evaluation and management by telemedicine and the availability of in person appointments. The patient expressed understanding and agreed to proceed.  THERAPIST PROGRESS NOTE  Session Time: 1:00 pm-1:45 pm  Type of Therapy: Individual Therapy   Session#3  Treatment Goals addressed: Louis Vang" will manage mood and anxiety as evidenced by lowering school on PHQ-9 and GAD-7, improving sleep, managing anxious thoughts, and process past traumas for 5 out of 7 days for 60 days.  Interventions: Therapist utilized CBT and Solution focused brief therapy to address anxiety. Therapist provided support and space to patient while he shared his thoughts and feelings. Therapist had patient identify what has gone well and worked with patient to challenge negative thoughts that impact his mood.   Effectiveness: Patient was oriented x5 (person, place, situation, time, and object). Patient was casually dressed, and appropriately groomed. Patient was alert, engaged, pleasant, and cooperative. Patient noted that he is trying to find balance between being active and recovering/healing. Patient has been trying walk and work but not over do it. Patient also noted that when he takes a shower and ends it with a little cooler water, he sleeps better. Patient is trying to accept where he is at physically. 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 His job is understanding and have not spoke to him about needing to improve his work effort.   Patient was engaged in session. He responded well to interventions. Patient continues to meet criteria for Generalized Anxiety Disorder. Patient will continue in  outpatient therapy due to being the least restrictive service to meet his needs. Patient made minimal progress on his goals.   Suicidal/Homicidal: Negativewithout intent/plan  Plan: Return again in 1-2 weeks.  Diagnosis: Axis I: Generalized Anxiety Disorder    Axis II: No diagnosis I discussed the assessment and treatment plan with the patient. The patient was provided an opportunity to ask questions and all were answered. The patient agreed with the plan and demonstrated an understanding of the instructions.   The patient was advised to call back or seek an in-person evaluation if the symptoms worsen or if the condition fails to improve as anticipated.  I provided 45 minutes of non-face-to-face time during this encounter.   [name], LCSW 11/10/2020

## 2020-11-17 ENCOUNTER — Other Ambulatory Visit: Payer: Self-pay | Admitting: Nurse Practitioner

## 2020-11-17 MED ORDER — ESCITALOPRAM OXALATE 10 MG PO TABS: 10.0000 mg | ORAL_TABLET | Freq: Every day | ORAL | 0 refills | Status: DC

## 2020-11-18 ENCOUNTER — Ambulatory Visit: Payer: PRIVATE HEALTH INSURANCE

## 2020-11-19 ENCOUNTER — Ambulatory Visit (INDEPENDENT_AMBULATORY_CARE_PROVIDER_SITE_OTHER): Payer: PRIVATE HEALTH INSURANCE | Admitting: Licensed Clinical Social Worker

## 2020-11-19 DIAGNOSIS — F411 Generalized anxiety disorder: Secondary | ICD-10-CM

## 2020-11-20 NOTE — Progress Notes (Signed)
Virtual Visit via Video Note  I connected with Louis Vang on 11/20/20 at  9:00 AM EDT by a video enabled telemedicine application and verified that I am speaking with the correct person using two identifiers.  Location: Patient: Home Provider: Office   I discussed the limitations of evaluation and management by telemedicine and the availability of in person appointments. The patient expressed understanding and agreed to proceed.  THERAPIST PROGRESS NOTE  Session Time: 9:00 am-9:45 am  Type of Therapy: Individual Therapy   Session#4  Treatment Goals addressed: Louis "[name]" will manage mood and anxiety as evidenced by lowering school on PHQ-9 and GAD-7, improving sleep, managing anxious thoughts, and process past traumas for 5 out of 7 days for 60 days.  Interventions: Therapist utilized CBT and Solution focused brief therapy to address anxiety. Therapist provided support and empathy to patient during session. Therapist had patient identify pre-session change/improvement. Therapist worked with patient to maintain his progress.   Effectiveness: Patient was oriented x5 (person, place, situation, time, and object). Patient was casually dressed, and appropriately groomed. Patient was alert, engaged, pleasant, and cooperative. Patient was in a good mood. Patient has been resting, and recovering. His medication has been helping with sleep, and he has been improving overall. He is supposed to be part of a wedding in the summer but worries about crashing/physically relapsing. Patient is going to text his friend and let him know that he can't be a groomsmen but will come to the wedding. Patient is working and feels brain fog.   Patient was engaged in session. He responded well to interventions. Patient continues to meet criteria for Generalized Anxiety Disorder. Patient will continue in outpatient therapy due to being the least restrictive service to meet his needs. Patient made minimal progress on  his goals.   Suicidal/Homicidal: Negativewithout intent/plan  Plan: Return again in 2-4 weeks.  Diagnosis: Axis I: Generalized Anxiety Disorder    Axis II: No diagnosis   I discussed the assessment and treatment plan with the patient. The patient was provided an opportunity to ask questions and all were answered. The patient agreed with the plan and demonstrated an understanding of the instructions.   The patient was advised to call back or seek an in-person evaluation if the symptoms worsen or if the condition fails to improve as anticipated.  I provided 45 minutes of non-face-to-face time during this encounter.   [name], LCSW 11/20/2020

## 2020-11-24 ENCOUNTER — Ambulatory Visit (HOSPITAL_COMMUNITY): Payer: PRIVATE HEALTH INSURANCE | Admitting: Licensed Clinical Social Worker

## 2020-11-30 ENCOUNTER — Ambulatory Visit: Payer: PRIVATE HEALTH INSURANCE

## 2020-12-08 ENCOUNTER — Ambulatory Visit (INDEPENDENT_AMBULATORY_CARE_PROVIDER_SITE_OTHER): Payer: PRIVATE HEALTH INSURANCE | Admitting: Nurse Practitioner

## 2020-12-08 ENCOUNTER — Ambulatory Visit (HOSPITAL_COMMUNITY): Payer: PRIVATE HEALTH INSURANCE | Admitting: Licensed Clinical Social Worker

## 2020-12-08 ENCOUNTER — Other Ambulatory Visit: Payer: Self-pay

## 2020-12-08 VITALS — BP 119/80 | HR 67 | Temp 97.8°F | Resp 18

## 2020-12-08 DIAGNOSIS — R439 Unspecified disturbances of smell and taste: Secondary | ICD-10-CM

## 2020-12-08 DIAGNOSIS — Z8616 Personal history of COVID-19: Secondary | ICD-10-CM

## 2020-12-08 DIAGNOSIS — R519 Headache, unspecified: Secondary | ICD-10-CM

## 2020-12-08 DIAGNOSIS — R413 Other amnesia: Secondary | ICD-10-CM | POA: Diagnosis not present

## 2020-12-08 NOTE — Progress Notes (Signed)
@Patient  ID: , male    DOB: 02/18/1991, 30 y.o.   MRN: [id number]  Chief Complaint  Patient presents with   Follow-up    Referring provider: [id number], MD  HPI  Patient presents today for post-COVID care clinic visit follow-up.  Patient was last seen in our office on 11/02/2020.  He states that overall he is much improved.  His fatigue is improving.  He has not started physical therapy.  Patient states that he has been having ongoing headaches with altered taste and smell prior to the headaches.  He states that when he gets these headaches it is debilitating.  We discussed that we can refer him to neurology for further evaluation. Denies f/c/s, n/v/d, hemoptysis, PND, chest pain or edema.      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   famotidine (PEPCID) 20 MG tablet Take 20 mg by mouth 2 (two) times daily.   VYVANSE 10 MG capsule Take 10 mg by mouth every morning.   No facility-administered encounter medications on file as of 12/08/2020.     Review of Systems  Review of Systems  Constitutional:  Positive for fatigue.  HENT: Negative.    Eyes: Negative.   Respiratory: Negative.     Cardiovascular: Negative.   Gastrointestinal: Negative.   Endocrine: Negative.   Genitourinary: Negative.   Musculoskeletal: Negative.   Allergic/Immunologic: Negative.   Neurological:  Positive for headaches.  Hematological: Negative.   Psychiatric/Behavioral: Negative.        Physical Exam  BP 119/80   Pulse 67   Temp 97.8 F (36.6 C)   Resp 18   SpO2 100%   Wt Readings from Last 5 Encounters:  10/19/20 174 lb (78.9 kg)  08/27/20 172 lb (78 kg)  05/22/18 164 lb (74.4 kg)  05/17/18 165 lb (74.8 kg)  02/03/18 158 lb 12 oz (72 kg)     Physical Exam Vitals and nursing note reviewed.  Constitutional:      General: He is not in acute distress.    Appearance: He is well-developed.  Cardiovascular:     Rate and Rhythm: Normal rate and regular rhythm.  Pulmonary:     Effort: Pulmonary effort is normal.     Breath sounds: Normal breath sounds.  Skin:    General: Skin is warm and dry.  Neurological:     Mental Status: He is alert and oriented to person, place, and time.     Lab Results:  CBC    Component Value Date/Time   WBC 6.9 10/19/2020 1418   RBC 5.14 10/19/2020 1418   HGB 15.6 10/19/2020 1418   HCT 44.1 10/19/2020 1418   PLT 351 10/19/2020 1418  MCV 86 10/19/2020 1418   MCH 30.4 10/19/2020 1418   MCHC 35.4 10/19/2020 1418   RDW 12.2 10/19/2020 1418    BMET    Component Value Date/Time   NA 137 10/19/2020 1418   K 4.2 10/19/2020 1418   CL 101 10/19/2020 1418   CO2 20 10/19/2020 1418   GLUCOSE 95 10/19/2020 1418   GLUCOSE 106 (H) 08/05/2013 0443   BUN 12 10/19/2020 1418   CREATININE 0.92 10/19/2020 1418   CALCIUM 9.8 10/19/2020 1418   GFRNONAA 101 07/31/2020 1510   GFRAA 117 07/31/2020 1510      Assessment & Plan:   History of COVID-19 Fatigue Memory loss:     Stay well hydrated   Stay active   Deep breathing exercises   May start vitamin C daily, vitamin D3 daily, Zinc daily   May take tylenol or fever or pain   Speech  and physical therapy have been ordered   Continue lexapro   Headaches associated with altered smell:  Concerned for migraines or possible seizure activity - will place referral to Dr. Vickey Huger for further evaluation  May start magnesium 600 mg daily  May start riboflavin 400 mg daily     Follow up:   Follow up after neurology       Ivonne Andrew, NP 12/09/2020

## 2020-12-08 NOTE — Patient Instructions (Signed)
History of COVID-19 Fatigue Memory loss:     Stay well hydrated   Stay active   Deep breathing exercises   May start vitamin C daily, vitamin D3 daily, Zinc daily   May take tylenol or fever or pain   Speech and physical therapy have been ordered   Continue lexapro   Headaches associated with altered smell:  Concerned for migraines or possible seizure activity - will place referral to Dr. Vickey Huger for further evaluation  May start magnesium 600 mg daily  May start riboflavin 400 mg daily     Follow up:   Follow up after neurology

## 2020-12-09 DIAGNOSIS — R519 Headache, unspecified: Secondary | ICD-10-CM | POA: Insufficient documentation

## 2020-12-09 DIAGNOSIS — R439 Unspecified disturbances of smell and taste: Secondary | ICD-10-CM | POA: Insufficient documentation

## 2020-12-09 DIAGNOSIS — R413 Other amnesia: Secondary | ICD-10-CM | POA: Insufficient documentation

## 2020-12-09 NOTE — Assessment & Plan Note (Signed)
Fatigue Memory loss:     Stay well hydrated   Stay active   Deep breathing exercises   May start vitamin C daily, vitamin D3 daily, Zinc daily   May take tylenol or fever or pain   Speech and physical therapy have been ordered   Continue lexapro   Headaches associated with altered smell:  Concerned for migraines or possible seizure activity - will place referral to Dr. Vickey Huger for further evaluation  May start magnesium 600 mg daily  May start riboflavin 400 mg daily     Follow up:   Follow up after neurology

## 2020-12-16 ENCOUNTER — Ambulatory Visit: Payer: PRIVATE HEALTH INSURANCE | Attending: Nurse Practitioner

## 2020-12-16 ENCOUNTER — Other Ambulatory Visit: Payer: Self-pay

## 2020-12-16 DIAGNOSIS — R41841 Cognitive communication deficit: Secondary | ICD-10-CM | POA: Diagnosis present

## 2020-12-16 NOTE — Patient Instructions (Signed)
[employer] app (14 day free trial) -remembering, staying focused, listening and understanding, planning and organizing  Strategies for Improving Your Attention and Memory  Use good eye-contact Give the speaker your undivided attention Look directly at the speaker  Complete one task at a time Avoid multitasking Complete one task before starting a new one Write a note to yourself if you think of something else that needs to be done Let others know when you need quiet time and can't be interrupted Don't answer the phone, texts, or emails while you are working on another task  Put aside distracting thoughts If you find your mind wandering, refocus your attention on the speaker Avoid off-topic comments or responses that may divert your attention  If something important comes to mind, let the speaker know and pause to write yourself a note: "Do you mind holding on a minute, I have to write something down." Put thoughts on hold and focus on salient information  Limit distractions in your environment Think about the environment around you Limit background noise by turning off the TV or music, putting your phone away Close the door and work in quiet  Use active listening Actively participate in the conversation to stay focused Paraphrase what you have heard to include the most important details Adding some associations may help you remember Ask questions to clarify certain points Summarize the speaker's comments periodically Avoid nodding your head and using "mhm" responses as these are more passive and don't help your attention  Alert the other person/people It may be helpful to alert your listener to the fact that you may need reminders to keep on track Tell the speaker in advance that you may need to stop them and have them repeat salient information If you lose focus, interject and let the person know, "I'm sorry, I lost you, can you tell me again?"  Write down  information Write down pertinent information as it comes up, such as telephone numbers, names of people, addresses, details from appointments and conversations, etc.

## 2020-12-16 NOTE — Therapy (Signed)
[facility] [address] [place], [place], [address] Phone: [phone]   Fax:  [phone]  Speech Language Pathology Evaluation  Patient Details  Name: Louis Vang MRN: [id number] Date of Birth: [birth year] Referring Provider (SLP): Ivonne Andrew, NP   Encounter Date: 12/16/2020   End of Session - 12/16/20 1321     Visit Number 1    Number of Visits 9    Date for SLP Re-Evaluation 02/10/21    SLP Start Time 1230    SLP Stop Time  1315    SLP Time Calculation (min) 45 min    Activity Tolerance Patient tolerated treatment well             Past Medical History:  Diagnosis Date   Healing laceration 11/07/2011   web space right hand   Tympanic membrane perforation 10/2011   bilateral    Past Surgical History:  Procedure Laterality Date   MYRINGOPLASTY  age 80   MYRINGOPLASTY W/ PAPER PATCH  11/11/2011   Procedure: MYRINGOPLASTY WITH CIGARETTE PAPER;  Surgeon: Drema Halon, MD;  Location: [facility];  Service: ENT;  Laterality: Right;   fat graft   TONSILLECTOMY     TYMPANOPLASTY  11/11/2011   Procedure: TYMPANOPLASTY;  Surgeon: Drema Halon, MD;  Location: [facility];  Service: ENT;  Laterality: Left;   WISDOM TOOTH EXTRACTION      There were no vitals filed for this visit.       SLP Evaluation [facility] - 12/16/20 1233       SLP Visit Information   SLP Received On 12/08/20    Referring Provider (SLP) Ivonne Andrew, NP    Onset Date 06-15-20    Medical Diagnosis COVID-19      Subjective   Patient/Family Stated Goal maximize thinking skills to aid return to baseline      General Information   HPI He tested positive for Covid on 06/15/2020. Increased difficulty with memory and problem solve reported since onset of COVID. Some spontaneous improvements reported; however, pt endorses he has not returned to baseline as needed for successful return to full  time employment      Balance Screen   Has the patient fallen in the past 6 months No      Prior Functional Status   Cognitive/Linguistic Baseline Baseline deficits    Baseline deficit details long term memory and attention (ADHD)    Type of Home House     Lives With Spouse    Available Support Friend(s)    Writer Part time employment   part time since last 6 weeks     Cognition   Overall Cognitive Status Impaired/Different from baseline    Area of Impairment Attention;Memory;Problem solving    Current Attention Level Alternating    Attention Comments diffIculty focusing    Memory Decreased short-term memory    Memory Comments reduced recall for work projects    Problem Solving Slow processing;Difficulty sequencing    Problem Solving Comments couldn't finish design project due to issues problem solving      Auditory Comprehension   Overall Auditory Comprehension Appears within functional limits for tasks assessed      Verbal Expression   Overall Verbal Expression Appears within functional limits for tasks assessed      Oral Motor/Sensory Function   Overall Oral Motor/Sensory Function Appears within functional limits for tasks assessed      Motor  Speech   Overall Motor Speech Appears within functional limits for tasks assessed      Standardized Assessments   Standardized Assessments  Cognitive Linguistic Quick Test      Cognitive Linguistic Quick Test (Ages 18-69)   Attention WNL    Memory Mild    Executive Function WNL    Language Mild    Visuospatial Skills WNL    Severity Rating Total 18    Composite Severity Rating 14.8                             SLP Education - 12/16/20 1354     Education Details eval results, possible goals, attention and memory compensations    Person(s) Educated Patient    Methods Explanation;Demonstration;Handout    Comprehension Verbalized understanding;Returned demonstration;Need further  instruction              SLP Short Term Goals - 12/16/20 1328       SLP SHORT TERM GOAL #1   Title Pt will report implementation of 2 memory and attention compensations to increase work performance with occasional min A over 2 sessions    Time 4    Period Weeks    Status New      SLP SHORT TERM GOAL #2   Title Pt will demonstrate ability to alternate and divide attention on structured therapeutic tasks with occasional min A over 2 sessions    Time 4    Period Weeks    Status New      SLP SHORT TERM GOAL #3   Title Pt will demonstrate ability to problem solve complex scenarios with occasional min A over 2 sessions    Time 4    Period Weeks    Status New              SLP Long Term Goals - 12/16/20 1353       SLP LONG TERM GOAL #1   Title Pt will report implementation of 4 memory and attention compensations to increase work performance with rare min A over 2 sessions    Time 8    Period Weeks    Status New      SLP LONG TERM GOAL #2   Title Pt will demonstrate ability to alternate and divide attention on structured therapeutic tasks with rare min A over 2 sessions    Time 8    Period Weeks    Status New      SLP LONG TERM GOAL #3   Title Pt will demonstrate ability to problem solve complex scenarios with rare min A over 2 sessions    Time 8    Period Weeks    Status New              Plan - 12/16/20 1321     Clinical Impression Statement "[name]" presents for OPST evaluation to assess changes in cognitive linguistic functioning s/p COVID-19 in December 2021. Pt indicates persistent "brain fog" and lack of "mental stamina." Pt is currently working as Sport and exercise psychologist; however, pt is only working part-time due to need for reduced work hours given current cognitive and physical functioning. Pt endorses some baseline difficulty with long term recall and has baseline ADHD (currently off ADHD meds due to current heightened health risks). Pt is currently  implementing some compensations, including use of pill organizer and writing down information. Pt endorsed decreased performance at work, as pt used to excel at  job tasks that are now more challenging to him. Pt is now having to delegate tasks to other co-workers due to reduced recall and problem solving abilities. CLQT completed this session, which revealed mild memory and language deficits. Pt noted to have increased difficulty with recall of story retell and recall for generative naming. SLP suspects attention deficits as well given patient reports and baseline, although attention deficits not highlighted on CLQT. 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    Speech Therapy Frequency 1x /week    Duration 8 weeks    Treatment/Interventions Compensatory strategies;Cognitive reorganization;Compensatory techniques;Internal/external aids;SLP instruction and feedback;Environmental controls;Functional tasks    Potential to Achieve Goals Good    SLP Home Exercise Plan provided    Consulted and Agree with Plan of Care Patient             Patient will benefit from skilled therapeutic intervention in order to improve the following deficits and impairments:   Cognitive communication deficit    Problem List Patient Active Problem List   Diagnosis Date Noted   Memory loss 12/09/2020   Nonintractable headache 12/09/2020   Olfactory impairment 12/09/2020   History of mononucleosis 09/23/2020   History of COVID-19 09/23/2020   Chronic diarrhea 09/23/2020   Snoring 09/23/2020   COVID-19 long hauler manifesting chronic fatigue 08/03/2020   COVID-19 long hauler manifesting chronic dyspnea 08/03/2020   COVID-19 long hauler manifesting chronic concentration deficit 08/03/2020    Janann Colonel, MA CCC-SLP 12/16/2020, 2:17 PM  [facility] [address] [place], [place], [address] Phone: [phone]   Fax:  [phone]  Name: Adon Gehlhausen MRN: [id number] Date of Birth: 04-Nov-1990

## 2020-12-22 ENCOUNTER — Other Ambulatory Visit: Payer: Self-pay | Admitting: Nurse Practitioner

## 2020-12-22 ENCOUNTER — Telehealth: Payer: Self-pay | Admitting: Nurse Practitioner

## 2020-12-22 MED ORDER — ESCITALOPRAM OXALATE 10 MG PO TABS: 10.0000 mg | ORAL_TABLET | Freq: Every day | ORAL | 0 refills | Status: DC

## 2020-12-22 NOTE — Telephone Encounter (Signed)
Sent to provider 

## 2020-12-24 ENCOUNTER — Ambulatory Visit: Payer: PRIVATE HEALTH INSURANCE

## 2020-12-31 ENCOUNTER — Ambulatory Visit: Payer: PRIVATE HEALTH INSURANCE

## 2021-01-01 ENCOUNTER — Other Ambulatory Visit: Payer: Self-pay

## 2021-01-01 ENCOUNTER — Ambulatory Visit: Payer: PRIVATE HEALTH INSURANCE | Attending: Nurse Practitioner

## 2021-01-01 DIAGNOSIS — R41841 Cognitive communication deficit: Secondary | ICD-10-CM | POA: Diagnosis present

## 2021-01-01 NOTE — Therapy (Signed)
[facility] [address] [place], [place], [address] Phone: [phone]   Fax:  [phone]  Speech Language Pathology Treatment  Patient Details  Name: Louis Vang MRN: [id number] Date of Birth: 1991-06-02 Referring Provider (SLP): Ivonne Andrew, NP   Encounter Date: 01/01/2021   End of Session - 01/01/21 1406     Visit Number 2    Number of Visits 9    Date for SLP Re-Evaluation 02/10/21    SLP Start Time 1317    SLP Stop Time  1400    SLP Time Calculation (min) 43 min    Activity Tolerance Patient tolerated treatment well             Past Medical History:  Diagnosis Date   Healing laceration 11/07/2011   web space right hand   Tympanic membrane perforation 10/2011   bilateral    Past Surgical History:  Procedure Laterality Date   MYRINGOPLASTY  age 36   MYRINGOPLASTY W/ PAPER PATCH  11/11/2011   Procedure: MYRINGOPLASTY WITH CIGARETTE PAPER;  Surgeon: Drema Halon, MD;  Location: [facility];  Service: ENT;  Laterality: Right;   fat graft   TONSILLECTOMY     TYMPANOPLASTY  11/11/2011   Procedure: TYMPANOPLASTY;  Surgeon: Drema Halon, MD;  Location: [facility];  Service: ENT;  Laterality: Left;   WISDOM TOOTH EXTRACTION      There were no vitals filed for this visit.   Subjective Assessment - 01/01/21 1317     Subjective "worse headaches"    Currently in Pain? Yes    Pain Score 2     Pain Location Head    Pain Descriptors / Indicators Aching    Pain Type Acute pain    Pain Onset 1 to 4 weeks ago    Pain Frequency Intermittent                   ADULT SLP TREATMENT - 01/01/21 1317       General Information   Behavior/Cognition Alert;Cooperative;Pleasant mood      Treatment Provided   Treatment provided Cognitive-Linquistic      Cognitive-Linquistic Treatment   Treatment focused on Cognition    Skilled Treatment Pt presented  today with recent c/o headaches lasting multiple days and varying severity (3-8). Headaches have impacted his ability to sustain focus at work. Pt does implement some breaks when cued by wife, but often becomes "hyperfixated" on completing tasks. Pt endorses daily work tasks are going well but increased difficulty with higher level organization tasks. SLP targeted problem solving, planning, and organizing tasks this session. Pt noted to work quickly, resulting in need to [employer] scenario x2. SLP reiterated slower rate to aid processing, in which pt verbalized understanding. SLP targeted planning meal and vacation, in which usual questioning cues required to ID all necessary components. Good mental calculation exhibited. Intermittent word finding exhibited this session.      Assessment / Recommendations / Plan   Plan Continue with current plan of care      Progression Toward Goals   Progression toward goals Progressing toward goals              SLP Education - 01/01/21 1406     Education Details slow rate of processing, double check, planning and organizing HEP    Person(s) Educated Patient    Methods Explanation;Demonstration;Handout    Comprehension Verbalized understanding;Returned demonstration;Need further instruction  SLP Short Term Goals - 01/01/21 1407       SLP SHORT TERM GOAL #1   Title Pt will report implementation of 2 memory and attention compensations to increase work performance with occasional min A over 2 sessions    Time 4    Period Weeks    Status On-going      SLP SHORT TERM GOAL #2   Title Pt will demonstrate ability to alternate and divide attention on structured therapeutic tasks with occasional min A over 2 sessions    Time 4    Period Weeks    Status On-going      SLP SHORT TERM GOAL #3   Title Pt will demonstrate ability to problem solve complex scenarios with occasional min A over 2 sessions    Time 4    Period Weeks    Status  On-going              SLP Long Term Goals - 01/01/21 1407       SLP LONG TERM GOAL #1   Title Pt will report implementation of 4 memory and attention compensations to increase work performance with rare min A over 2 sessions    Time 8    Period Weeks    Status On-going      SLP LONG TERM GOAL #2   Title Pt will demonstrate ability to alternate and divide attention on structured therapeutic tasks with rare min A over 2 sessions    Time 8    Period Weeks    Status On-going      SLP LONG TERM GOAL #3   Title Pt will demonstrate ability to problem solve complex scenarios with rare min A over 2 sessions    Time 8    Period Weeks    Status On-going              Plan - 01/01/21 1408     Clinical Impression Statement "Louis Vang" presents for OPST evaluation to assess changes in cognitive linguistic functioning s/p COVID-19 in December 2021. Pt c/o recent increased and persistent headaches. Pt endorsed daily work tasks are going well but he is having difficulty with high level tasks. SLP targeted functional problem solving, planning, and organizing skills, with intermittent rare min A and usual mod questioning required to expand upon responses for planning tasks. 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    Speech Therapy Frequency 1x /week    Duration 8 weeks    Treatment/Interventions Compensatory strategies;Cognitive reorganization;Compensatory techniques;Internal/external aids;SLP instruction and feedback;Environmental controls;Functional tasks    Potential to Achieve Goals Good    SLP Home Exercise Plan provided    Consulted and Agree with Plan of Care Patient             Patient will benefit from skilled therapeutic intervention in order to improve the following deficits and impairments:   Cognitive communication deficit    Problem List Patient Active  Problem List   Diagnosis Date Noted   Memory loss 12/09/2020   Nonintractable headache 12/09/2020   Olfactory impairment 12/09/2020   History of mononucleosis 09/23/2020   History of COVID-19 09/23/2020   Chronic diarrhea 09/23/2020   Snoring 09/23/2020   COVID-19 long hauler manifesting chronic fatigue 08/03/2020   COVID-19 long hauler manifesting chronic dyspnea 08/03/2020   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 [place], [place], [address] Phone: [phone]   Fax:  [phone]   Name: Louis Vang MRN: [id number] Date of Birth: [birth year]

## 2021-01-04 ENCOUNTER — Telehealth: Payer: Self-pay | Admitting: Nurse Practitioner

## 2021-01-05 ENCOUNTER — Encounter: Payer: Self-pay | Admitting: Nurse Practitioner

## 2021-01-06 ENCOUNTER — Ambulatory Visit: Payer: PRIVATE HEALTH INSURANCE

## 2021-01-06 ENCOUNTER — Other Ambulatory Visit: Payer: Self-pay

## 2021-01-06 DIAGNOSIS — R41841 Cognitive communication deficit: Secondary | ICD-10-CM | POA: Diagnosis not present

## 2021-01-07 ENCOUNTER — Ambulatory Visit: Payer: PRIVATE HEALTH INSURANCE

## 2021-01-07 NOTE — Patient Instructions (Signed)
  Please complete the assigned speech therapy homework prior to your next session and return it to the speech therapist at your next visit.  

## 2021-01-07 NOTE — Therapy (Signed)
[facility] [address] [place], [place], [address] Phone: [phone]   Fax:  [phone]  Speech Language Pathology Treatment  Patient Details  Name: Louis Vang MRN: [id number] Date of Birth: August 18, 1990 Referring Provider (SLP): Ivonne Andrew, NP   Encounter Date: 01/06/2021   End of Session - 01/07/21 1322     Visit Number 3    Number of Visits 9    Date for SLP Re-Evaluation 02/10/21    SLP Start Time 1445    SLP Stop Time  1530    SLP Time Calculation (min) 45 min    Activity Tolerance Patient tolerated treatment well             Past Medical History:  Diagnosis Date   Healing laceration 11/07/2011   web space right hand   Tympanic membrane perforation 10/2011   bilateral    Past Surgical History:  Procedure Laterality Date   MYRINGOPLASTY  age 72   MYRINGOPLASTY W/ PAPER PATCH  11/11/2011   Procedure: MYRINGOPLASTY WITH CIGARETTE PAPER;  Surgeon: Drema Halon, MD;  Location: [facility];  Service: ENT;  Laterality: Right;   fat graft   TONSILLECTOMY     TYMPANOPLASTY  11/11/2011   Procedure: TYMPANOPLASTY;  Surgeon: Drema Halon, MD;  Location: [facility];  Service: ENT;  Laterality: Left;   WISDOM TOOTH EXTRACTION      There were no vitals filed for this visit.   Subjective Assessment - 01/07/21 1245     Subjective Pt reports it's his first day back at work full time.                   ADULT SLP TREATMENT - 01/07/21 1245       General Information   Behavior/Cognition Alert;Cooperative;Pleasant mood      Treatment Provided   Treatment provided Cognitive-Linquistic      Cognitive-Linquistic Treatment   Treatment focused on Cognition    Skilled Treatment Pt completed homework today, however in vague way without enough details for a vacation to the beach. Forgot deodorant, medication, and food ("hamburgers, hot dogs, oatmeal,  chips"). Once SLP cued pt for more food he corrected with more variety of condiments, sides. Pt req'd cues to think about including price into how to choose accomodations (would not be a factor for him - but pt did not take into account it might be for the other two couples going with him and wife. SLP assisted pt think of a functional way to make to-do lists at home - pt had his list mostly eliminated after this last weekend.      Assessment / Recommendations / Plan   Plan Continue with current plan of care      Progression Toward Goals   Progression toward goals Progressing toward goals                SLP Short Term Goals - 01/06/21 1513       SLP SHORT TERM GOAL #1   Title Pt will report implementation of 2 memory and attention compensations to increase work performance with occasional min A over 2 sessions    Time 3    Period Weeks    Status On-going      SLP SHORT TERM GOAL #2   Title Pt will demonstrate ability to alternate and divide attention on structured therapeutic tasks with occasional min A over 2 sessions    Time 3  Period Weeks    Status On-going      SLP SHORT TERM GOAL #3   Title Pt will demonstrate ability to problem solve complex scenarios with occasional min A over 2 sessions    Time 3    Period Weeks    Status On-going              SLP Long Term Goals - 01/07/21 1323       SLP LONG TERM GOAL #1   Title Pt will report implementation of 4 memory and attention compensations to increase work performance with rare min A over 2 sessions    Time 7    Period Weeks    Status On-going      SLP LONG TERM GOAL #2   Title Pt will demonstrate ability to alternate and divide attention on structured therapeutic tasks with rare min A over 2 sessions    Time 7    Period Weeks    Status On-going      SLP LONG TERM GOAL #3   Title Pt will demonstrate ability to problem solve complex scenarios with rare min A over 2 sessions    Time 7    Period Weeks     Status On-going              Plan - 01/07/21 1323     Clinical Impression Statement "Louis Vang" presents for OPST evaluation to assess changes in cognitive linguistic functioning s/p COVID-19 in December 2021. Pt c/o recent increased and persistent headaches. Pt endorsed daily work tasks are going well but he is having difficulty with high level tasks. SLP targeted functional problem solving, planning, and organizing skills, with intermittent min A and mod questioning required to expand upon responses. 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    Speech Therapy Frequency 1x /week    Duration 8 weeks    Treatment/Interventions Compensatory strategies;Cognitive reorganization;Compensatory techniques;Internal/external aids;SLP instruction and feedback;Environmental controls;Functional tasks    Potential to Achieve Goals Good    SLP Home Exercise Plan provided    Consulted and Agree with Plan of Care Patient             Patient will benefit from skilled therapeutic intervention in order to improve the following deficits and impairments:   Cognitive communication deficit    Problem List Patient Active Problem List   Diagnosis Date Noted   Memory loss 12/09/2020   Nonintractable headache 12/09/2020   Olfactory impairment 12/09/2020   History of mononucleosis 09/23/2020   History of COVID-19 09/23/2020   Chronic diarrhea 09/23/2020   Snoring 09/23/2020   COVID-19 long hauler manifesting chronic fatigue 08/03/2020   COVID-19 long hauler manifesting chronic dyspnea 08/03/2020   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

## 2021-01-14 ENCOUNTER — Ambulatory Visit: Payer: PRIVATE HEALTH INSURANCE

## 2021-01-20 ENCOUNTER — Ambulatory Visit (INDEPENDENT_AMBULATORY_CARE_PROVIDER_SITE_OTHER): Payer: PRIVATE HEALTH INSURANCE | Admitting: Licensed Clinical Social Worker

## 2021-01-20 DIAGNOSIS — F411 Generalized anxiety disorder: Secondary | ICD-10-CM | POA: Diagnosis not present

## 2021-01-20 NOTE — Progress Notes (Signed)
Virtual Visit via Video Note  I connected with Louis Vang on 01/20/21 at 11:00 AM EDT by a video enabled telemedicine application and verified that I am speaking with the correct person using two identifiers.  Location: Patient: Home Provider: Office   I discussed the limitations of evaluation and management by telemedicine and the availability of in person appointments. The patient expressed understanding and agreed to proceed.  THERAPIST PROGRESS NOTE  Session Time: 11:00 am-11:45 am  Type of Therapy: Individual Therapy   Session#5  Treatment Goals addressed: Louis Vang" will manage mood and anxiety as evidenced by lowering school on PHQ-9 and GAD-7, improving sleep, managing anxious thoughts, and process past traumas for 5 out of 7 days for 60 days.  Interventions: Therapist utilized CBT and Solution focused brief therapy to address anxiety. Therapist provided support and empathy to patient during session. Therapist had patient identify what has gone well prior to session. Therapist encouraged patient to continue to focus on managing his long covid symptoms to manage his mood and anxiety.   Effectiveness: Patient was oriented x5 (person, place, situation, time, and object). Patient was casually dressed, and appropriately groomed. Patient was alert, engaged, pleasant, and cooperative. Patient reported that he has been feeling better. He is not sure if he is getting better or if he is pacing himself better. He is not anxious even with the "vibrations" that he feels in his legs. Patient does have disrupted mood at times when his long covid symptoms increase but for the most part he is stable. Patient is not thinking about his past. Patient is going to continue to focus on managing his health which impacts his mood.   Patient was engaged in session. He responded well to interventions. Patient continues to meet criteria for Generalized Anxiety Disorder. Patient will continue in outpatient  therapy due to being the least restrictive service to meet his needs. Patient made moderate progress on his goals.   Suicidal/Homicidal: Negativewithout intent/plan  Plan: Return again in 2-4 weeks.  Diagnosis: Axis I: Generalized Anxiety Disorder    Axis II: No diagnosis   I discussed the assessment and treatment plan with the patient. The patient was provided an opportunity to ask questions and all were answered. The patient agreed with the plan and demonstrated an understanding of the instructions.   The patient was advised to call back or seek an in-person evaluation if the symptoms worsen or if the condition fails to improve as anticipated.  I provided 45 minutes of non-face-to-face time during this encounter.   [name], LCSW 01/20/2021

## 2021-01-21 ENCOUNTER — Ambulatory Visit: Payer: PRIVATE HEALTH INSURANCE

## 2021-01-21 ENCOUNTER — Other Ambulatory Visit: Payer: Self-pay

## 2021-01-21 DIAGNOSIS — R41841 Cognitive communication deficit: Secondary | ICD-10-CM | POA: Diagnosis not present

## 2021-01-21 NOTE — Progress Notes (Deleted)
   New Patient Office Visit  Subjective:  Patient ID: Louis Vang, male    DOB: 09-08-1990  Age: 30 y.o. MRN: [id number]  CC: No chief complaint on file.   HPI Kennen Stammer presents for ***  Past Medical History:  Diagnosis Date   Healing laceration 11/07/2011   web space right hand   Tympanic membrane perforation 10/2011   bilateral    Past Surgical History:  Procedure Laterality Date   MYRINGOPLASTY  age 23   MYRINGOPLASTY W/ PAPER PATCH  11/11/2011   Procedure: MYRINGOPLASTY WITH CIGARETTE PAPER;  Surgeon: Drema Halon, MD;  Location: [facility];  Service: ENT;  Laterality: Right;   fat graft   TONSILLECTOMY     TYMPANOPLASTY  11/11/2011   Procedure: TYMPANOPLASTY;  Surgeon: Drema Halon, MD;  Location: [facility];  Service: ENT;  Laterality: Left;   WISDOM TOOTH EXTRACTION      No family history on file.  Social History   Socioeconomic History   Marital status: Married    Spouse name: Not on file   Number of children: Not on file   Years of education: Not on file   Highest education level: Not on file  Occupational History   Not on file  Tobacco Use   Smoking status: Former    Years: 2.00    Types: Cigarettes   Smokeless tobacco: Never   Tobacco comments:    5 cig./week  Substance and Sexual Activity   Alcohol use: Yes    Comment: 2-3 x/week   Drug use: No   Sexual activity: Not on file  Other Topics Concern   Not on file  Social History Narrative   Not on file   Social Determinants of Health   Financial Resource Strain: Not on file  Food Insecurity: Not on file  Transportation Needs: Not on file  Physical Activity: Not on file  Stress: Not on file  Social Connections: Not on file  Intimate Partner Violence: Not on file    ROS Review of Systems  Objective:   Today's Vitals: There were no vitals taken for this visit.  Physical Exam  Assessment & Plan:   Problem List Items Addressed This  Visit   None Visit Diagnoses     Encounter to establish care    -  Primary       Outpatient Encounter Medications as of 01/22/2021  Medication Sig   escitalopram (LEXAPRO) 10 MG tablet Take 1 tablet (10 mg total) by mouth daily.   famotidine (PEPCID) 20 MG tablet Take 20 mg by mouth 2 (two) times daily.   VYVANSE 10 MG capsule Take 10 mg by mouth every morning. (Patient not taking: Reported on 12/16/2020)   No facility-administered encounter medications on file as of 01/22/2021.    Follow-up: No follow-ups on file.   Thayer Ohm, DNP, APRN, FNP-BC [facility]

## 2021-01-21 NOTE — Therapy (Signed)
[facility] [address] [place], [place], [address] Phone: [phone]   Fax:  [phone]  Speech Language Pathology Treatment  Patient Details  Name: Louis Vang MRN: [id number] Date of Birth: April 12, 1991 Referring Provider (SLP): Ivonne Andrew, NP   Encounter Date: 01/21/2021   End of Session - 01/21/21 1309     Visit Number 4    Number of Visits 9    Date for SLP Re-Evaluation 02/10/21    SLP Start Time 1315    SLP Stop Time  1400    SLP Time Calculation (min) 45 min    Activity Tolerance Patient tolerated treatment well             Past Medical History:  Diagnosis Date   Healing laceration 11/07/2011   web space right hand   Tympanic membrane perforation 10/2011   bilateral    Past Surgical History:  Procedure Laterality Date   MYRINGOPLASTY  age 30   MYRINGOPLASTY W/ PAPER PATCH  11/11/2011   Procedure: MYRINGOPLASTY WITH CIGARETTE PAPER;  Surgeon: Drema Halon, MD;  Location: [facility];  Service: ENT;  Laterality: Right;   fat graft   TONSILLECTOMY     TYMPANOPLASTY  11/11/2011   Procedure: TYMPANOPLASTY;  Surgeon: Drema Halon, MD;  Location: [facility];  Service: ENT;  Laterality: Left;   WISDOM TOOTH EXTRACTION      There were no vitals filed for this visit.   Subjective Assessment - 01/21/21 1314     Subjective "generally better"    Currently in Pain? No/denies                   ADULT SLP TREATMENT - 01/21/21 1309       General Information   Behavior/Cognition Alert;Cooperative;Pleasant mood      Treatment Provided   Treatment provided Cognitive-Linquistic      Cognitive-Linquistic Treatment   Treatment focused on Cognition    Skilled Treatment Bobby endorsed headaches are "a lot better" since last ST session. Pt transitioned back to full time with smooth transition reported. Pt explained difficulty with coding scenario  at work given current reduced ability to mentally visualize high level patterns. SLP discussed possible strategies and solutions to optimize problem solving for complex tasks, in which pt identified possible solution to "compartmentalize." Pt also endorsed slowing down would be useful as pt reports awareness of this since initiation of ST intervention. SLP educated pt on recommended strategies to aid cognitive functioning, including sticky note on monitor to cue self to slow down, reading entirety of instructions, and double/triple checking work. Pt presents with improvements in emergent and anticipatory awareness to aid self during complex cognitive tasks.Of note, occasional word finding exhibited this session. SLP to further address word finding in upcoming ST sessions.      Assessment / Recommendations / Plan   Plan Continue with current plan of care      Progression Toward Goals   Progression toward goals Progressing toward goals              SLP Education - 01/21/21 1416     Education Details sticky note on monitor, slow rate, read instructions thoroughly, double check    Person(s) Educated Patient    Methods Explanation;Demonstration;Handout    Comprehension Verbalized understanding;Returned demonstration              SLP Short Term Goals - 01/21/21 1310       SLP  SHORT TERM GOAL #1   Title Pt will report implementation of 2 memory and attention compensations to increase work performance with occasional min A over 2 sessions    Time 2    Period Weeks    Status On-going      SLP SHORT TERM GOAL #2   Title Pt will demonstrate ability to alternate and divide attention on structured therapeutic tasks with occasional min A over 2 sessions    Time 2    Period Weeks    Status On-going      SLP SHORT TERM GOAL #3   Title Pt will demonstrate ability to problem solve complex scenarios with occasional min A over 2 sessions    Baseline 01-21-21    Time 2    Period Weeks     Status On-going              SLP Long Term Goals - 01/21/21 1310       SLP LONG TERM GOAL #1   Title Pt will report implementation of 4 memory and attention compensations to increase work performance with rare min A over 2 sessions    Time 6    Period Weeks    Status On-going      SLP LONG TERM GOAL #2   Title Pt will demonstrate ability to alternate and divide attention on structured therapeutic tasks with rare min A over 2 sessions    Time 6    Period Weeks    Status On-going      SLP LONG TERM GOAL #3   Title Pt will demonstrate ability to problem solve complex scenarios with rare min A over 2 sessions    Time 6    Period Weeks    Status On-going              Plan - 01/21/21 1310     Clinical Impression Statement "Louis Vang" presents for OPST intervention to address changes in cognitive linguistic functioning s/p COVID-19 in December 2021. Pt reports improvements in headaches and return to full time employment. Pt endorsed daily work tasks are going well but he is having difficulty with high level tasks requiring mental visualization. SLP targeted functional problem solving and awareness this session, with recommendations provided to aid cognitive functioning. 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    Treatment/Interventions Compensatory strategies;Cognitive reorganization;Compensatory techniques;Internal/external aids;SLP instruction and feedback;Environmental controls;Functional tasks;Patient/family education    Potential to Achieve Goals Good    SLP Home Exercise Plan provided    Consulted and Agree with Plan of Care Patient             Patient will benefit from skilled therapeutic intervention in order to improve the following deficits and impairments:   Cognitive communication deficit    Problem List Patient Active Problem List   Diagnosis  Date Noted   Memory loss 12/09/2020   Nonintractable headache 12/09/2020   Olfactory impairment 12/09/2020   History of mononucleosis 09/23/2020   History of COVID-19 09/23/2020   Chronic diarrhea 09/23/2020   Snoring 09/23/2020   COVID-19 long hauler manifesting chronic fatigue 08/03/2020   COVID-19 long hauler manifesting chronic dyspnea 08/03/2020   COVID-19 long hauler manifesting chronic concentration deficit 08/03/2020    Janann Colonel, MA CCC-SLP 01/21/2021, 2:21 PM  [facility] [address] [place], [place], [address] Phone: [phone]   Fax:  [phone]   Name: Louis Vang MRN: [id number] Date of Birth: 1991-01-04

## 2021-01-21 NOTE — Patient Instructions (Signed)
Put sticky note on your computer monitor to remind yourself  1) Slow down  2) Read ALL of the instructions multiple times  3) See if you can approach the problem from a different angle/perspective  4) Double/Triple Check  5) Don't be afraid to ask for help - use this as a learning opportunity

## 2021-01-22 ENCOUNTER — Ambulatory Visit (INDEPENDENT_AMBULATORY_CARE_PROVIDER_SITE_OTHER): Payer: PRIVATE HEALTH INSURANCE | Admitting: Medical-Surgical

## 2021-01-22 DIAGNOSIS — Z5329 Procedure and treatment not carried out because of patient's decision for other reasons: Secondary | ICD-10-CM

## 2021-01-22 DIAGNOSIS — Z7689 Persons encountering health services in other specified circumstances: Secondary | ICD-10-CM

## 2021-02-03 ENCOUNTER — Ambulatory Visit (INDEPENDENT_AMBULATORY_CARE_PROVIDER_SITE_OTHER): Payer: PRIVATE HEALTH INSURANCE | Admitting: Licensed Clinical Social Worker

## 2021-02-03 DIAGNOSIS — F411 Generalized anxiety disorder: Secondary | ICD-10-CM

## 2021-02-04 ENCOUNTER — Ambulatory Visit: Payer: PRIVATE HEALTH INSURANCE | Attending: Nurse Practitioner

## 2021-02-04 ENCOUNTER — Other Ambulatory Visit: Payer: Self-pay

## 2021-02-04 DIAGNOSIS — R41841 Cognitive communication deficit: Secondary | ICD-10-CM | POA: Diagnosis present

## 2021-02-04 DIAGNOSIS — R4701 Aphasia: Secondary | ICD-10-CM | POA: Insufficient documentation

## 2021-02-04 NOTE — Therapy (Signed)
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   Subjective Assessment - 02/04/21 1318     Subjective "I've been more mindful"    Currently in Pain? No/denies                   ADULT SLP TREATMENT - 02/04/21 1404       General Information   Behavior/Cognition Alert;Cooperative;Pleasant mood      Treatment Provided   Treatment provided Cognitive-Linquistic      Cognitive-Linquistic Treatment   Treatment focused on Cognition    Skilled Treatment Pt reports having been more mindful to slow down his processing as well as stopping and re-wroking problems if errors noted, which has been successful. Pt reports experiencing word finding 5-10x/day. SLP educated pt on word  finding strategies and recommendations for cueing hierarchy in order to aid anomia. Pt verbalized understanding and able to demonstrate return of education by providing applicable examples. SLP targeted use of other memory compensations and memory aids due to reported forgetfulness. SLP recommended use of digital to-do lists with priority system, reminder app in phone, and resuming previous techniques that aided recall and organizaiton (household inventory chart and family to-do lists with priorities).      Assessment / Recommendations / Plan   Plan Continue with current plan of care      Progression Toward Goals   Progression toward goals Progressing toward goals              SLP Education - 02/04/21 1336     Education Details anomia, sticky notes for daily organization    Person(s) Educated Patient    Methods Explanation;Demonstration;Handout    Comprehension Verbalized understanding;Returned demonstration;Need further instruction              SLP Short Term Goals - 02/04/21 1411       SLP SHORT TERM GOAL #1   Title Pt will report implementation of 2 memory and attention compensations to increase work performance with occasional min A over 2 sessions    Baseline 02-04-21    Status Partially Met  SLP SHORT TERM GOAL #2   Title Pt will demonstrate ability to alternate and divide attention on structured therapeutic tasks with occasional min A over 2 sessions    Status Partially Met      SLP SHORT TERM GOAL #3   Title Pt will demonstrate ability to problem solve complex scenarios with occasional min A over 2 sessions    Baseline 01-21-21, 02-04-21    Status Achieved              SLP Long Term Goals - 02/04/21 1412       SLP LONG TERM GOAL #1   Title Pt will report implementation of 4 memory and attention compensations to increase work performance with rare min A over 2 sessions    Time 5    Period Weeks    Status On-going      SLP LONG TERM GOAL #2   Title  Pt will demonstrate ability to alternate and divide attention on structured therapeutic tasks with rare min A over 2 sessions    Time 5    Period Weeks    Status On-going      SLP LONG TERM GOAL #3   Title Pt will demonstrate ability to problem solve complex scenarios with rare min A over 2 sessions    Time 5    Period Weeks    Status On-going              Plan - 02/04/21 1412     Clinical Impression Statement "Louis Vang" presents for OPST intervention to address changes in cognitive linguistic functioning s/p COVID-19 in December 2021. Pt reports improvements with recommended attention/memory techniques which have been effective for full time employment. Pt endorsed intermittent word finding, in which SLP provided education and training of strategies and cueing techniques. Ongoing education and training conducted re: use of compensations and external memory aids to optimize functional recall and attention during daily living. 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    Speech Therapy Frequency 1x /week   Pt requested every other week   Duration 8 weeks    Treatment/Interventions Compensatory strategies;Cognitive reorganization;Compensatory techniques;Internal/external aids;SLP instruction and feedback;Environmental controls;Functional tasks;Patient/family education    Potential to Achieve Goals Good    SLP Home Exercise Plan provided    Consulted and Agree with Plan of Care Patient             Patient will benefit from skilled therapeutic intervention in order to improve the following deficits and impairments:   Cognitive communication deficit    Problem List Patient Active Problem List   Diagnosis Date Noted   Memory loss 12/09/2020   Nonintractable headache 12/09/2020   Olfactory impairment 12/09/2020   History of mononucleosis 09/23/2020   History of COVID-19  09/23/2020   Chronic diarrhea 09/23/2020   Snoring 09/23/2020   COVID-19 long hauler manifesting chronic fatigue 08/03/2020   COVID-19 long hauler manifesting chronic dyspnea 08/03/2020   COVID-19 long hauler manifesting chronic concentration deficit 08/03/2020    Alinda Deem, MA CCC-SLP 02/04/2021, 2:14 PM  [facility] [address] [place], [place], [address] Phone: [phone]   Fax:  [phone]   Name: Louis Vang MRN: [id number] Date of Birth: [birth year]

## 2021-02-04 NOTE — Patient Instructions (Addendum)
When you have trouble saying the word you want to say:  1)  Describe it! Describe the size, color, shape, function, composition (what it's made of), and/or location to be able to have the word come sooner, or to have your listener help you out  2) "talk around the word" (say it a totally different way) -get your point out using different words than the one/ones you can't think of  3) Use a synonym - think of another word that means the exact same thing  4) DRAW! You can draw some things you want to say in order to give your listener a hint about what you're talking about  5)  Gesture- make motions to help your listener understand what you are trying to communicate  6) Write down the word, if you can - or the first letter or letters, to help you say the word or to give your listener a hint about what you're trying to say     Use "WARM" strategy for memory. W= write it down A=  associate it R=  repeat it M=  make a mental picture  Repeat to yourself when you were going into room to recall what you were looking for   Figure out an organizational system to help recall daily tasks/things on your to-do list. Think about color coordination or a number system  Resume your inventory check-list! Make sure your wife knows how to use it  Use the reminders section in phone to recall to check the inventory and remember anything your wife wants you to do

## 2021-02-04 NOTE — Progress Notes (Signed)
Virtual Visit via Video Note  I connected with Louis Vang on 02/04/21 at  2:00 PM EDT by a video enabled telemedicine application and verified that I am speaking with the correct person using two identifiers.  Location: Patient: Home Provider: Office   I discussed the limitations of evaluation and management by telemedicine and the availability of in person appointments. The patient expressed understanding and agreed to proceed.  THERAPIST PROGRESS NOTE  Session Time: 2:00 pm-2:45 pm  Type of Therapy: Individual Therapy   Session#6  Treatment Goals addressed: [name]" will manage mood and anxiety as evidenced by lowering school on PHQ-9 and GAD-7, improving sleep, managing anxious thoughts, and process past traumas for 5 out of 7 days for 60 days.  Interventions: Therapist utilized CBT and Solution focused brief therapy to address anxiety. Therapist provided support and empathy to patient during session. Therapist had patient identify pre-session change. Therapist worked with patient to identify ways to slow down at work in order to avoid rushing through his work.   Effectiveness: Patient was oriented x5 (person, place, situation, time, and object). Patient was casually dressed, and appropriately groomed. Patient was alert, engaged, pleasant, and cooperative. Patient was doing well physically and his anxiety was stable. He noted that he does "rush" through his work and is trying to slow down a little. Patient is going to take breaks every 25 minutes or so to slow down.    Patient was engaged in session. He responded well to interventions. Patient continues to meet criteria for Generalized Anxiety Disorder. Patient will continue in outpatient therapy due to being the least restrictive service to meet his needs. Patient made moderate progress on his goals.   Suicidal/Homicidal: Negativewithout intent/plan  Plan: Return again in 2-4 weeks.  Diagnosis: Axis I: Generalized Anxiety  Disorder    Axis II: No diagnosis   I discussed the assessment and treatment plan with the patient. The patient was provided an opportunity to ask questions and all were answered. The patient agreed with the plan and demonstrated an understanding of the instructions.   The patient was advised to call back or seek an in-person evaluation if the symptoms worsen or if the condition fails to improve as anticipated.  I provided 45 minutes of non-face-to-face time during this encounter.   [name], LCSW 02/04/2021

## 2021-02-17 ENCOUNTER — Ambulatory Visit (INDEPENDENT_AMBULATORY_CARE_PROVIDER_SITE_OTHER): Payer: PRIVATE HEALTH INSURANCE | Admitting: Licensed Clinical Social Worker

## 2021-02-17 ENCOUNTER — Ambulatory Visit: Payer: PRIVATE HEALTH INSURANCE | Admitting: Medical-Surgical

## 2021-02-17 DIAGNOSIS — F411 Generalized anxiety disorder: Secondary | ICD-10-CM

## 2021-02-18 NOTE — Progress Notes (Signed)
Virtual Visit via Video Note  I connected with Louis Vang on 02/18/21 at  1:00 PM EDT by a video enabled telemedicine application and verified that I am speaking with the correct person using two identifiers.  Location: Patient: Home Provider: Office   I discussed the limitations of evaluation and management by telemedicine and the availability of in person appointments. The patient expressed understanding and agreed to proceed.  THERAPIST PROGRESS NOTE  Session Time: 1:00 pm-1:20 pm  Type of Therapy: Individual Therapy   Session#7  Treatment Goals addressed: Louis Vang" will manage mood and anxiety as evidenced by lowering school on PHQ-9 and GAD-7, improving sleep, managing anxious thoughts, and process past traumas for 5 out of 7 days for 60 days.  Interventions: Therapist utilized CBT and Solution focused brief therapy to address anxiety. Therapist provided support and empathy to patient during session. Therapist updated patient's treatment plan and discontinued treatment.   Effectiveness: Patient was oriented x5 (person, place, situation, time, and object). Patient was casually dressed, and appropriately groomed. Patient was alert, engaged, pleasant, and cooperative. Patient is doing well physically overall. He is coping with his mood and anxieties. Patient feels like he his goals for treatment. Patient will discontinue treatment at this time.   Patient was engaged in session. He responded well to interventions. Patient continues to meet criteria for Generalized Anxiety Disorder. Patient will discontinue outpatient therapy. Patient achieved his goals.   Suicidal/Homicidal: Negativewithout intent/plan  Plan: Return again in 2-4 weeks.  Diagnosis: Axis I: Generalized Anxiety Disorder    Axis II: No diagnosis   I discussed the assessment and treatment plan with the patient. The patient was provided an opportunity to ask questions and all were answered. The patient agreed  with the plan and demonstrated an understanding of the instructions.   The patient was advised to call back or seek an in-person evaluation if the symptoms worsen or if the condition fails to improve as anticipated.  I provided 20 minutes of non-face-to-face time during this encounter.   [name], LCSW 02/18/2021

## 2021-02-19 ENCOUNTER — Ambulatory Visit: Payer: PRIVATE HEALTH INSURANCE

## 2021-02-19 ENCOUNTER — Other Ambulatory Visit: Payer: Self-pay

## 2021-02-19 DIAGNOSIS — R4701 Aphasia: Secondary | ICD-10-CM

## 2021-02-19 DIAGNOSIS — R41841 Cognitive communication deficit: Secondary | ICD-10-CM | POA: Diagnosis not present

## 2021-02-19 NOTE — Therapy (Signed)
[facility] [address] [place], [place], [address] Phone: [phone]   Fax:  [phone]  Speech Language Pathology Treatment/renewal summary  Patient Details  Name: Louis Vang MRN: [id number] Date of Birth: 17-Oct-1990 Referring Provider (SLP): Fenton Foy, NP   Encounter Date: 02/19/2021   End of Session - 02/19/21 1528     Visit Number 6    Number of Visits 9    Date for SLP Re-Evaluation 03/19/21    SLP Start Time 1449    SLP Stop Time  1526    SLP Time Calculation (min) 37 min    Activity Tolerance Patient tolerated treatment well             Past Medical History:  Diagnosis Date   Healing laceration 11/07/2011   web space right hand   Tympanic membrane perforation 10/2011   bilateral    Past Surgical History:  Procedure Laterality Date   MYRINGOPLASTY  age 26   MYRINGOPLASTY W/ PAPER PATCH  11/11/2011   Procedure: MYRINGOPLASTY WITH CIGARETTE PAPER;  Surgeon: Rozetta Nunnery, MD;  Location: [facility];  Service: ENT;  Laterality: Right;   fat graft   TONSILLECTOMY     TYMPANOPLASTY  11/11/2011   Procedure: TYMPANOPLASTY;  Surgeon: Rozetta Nunnery, MD;  Location: [facility];  Service: ENT;  Laterality: Left;   WISDOM TOOTH EXTRACTION      There were no vitals filed for this visit.   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 No/denies                   ADULT SLP TREATMENT - 02/19/21 1455       General Information   Behavior/Cognition Alert;Cooperative;Pleasant mood      Treatment Provided   Treatment provided Cognitive-Linquistic      Cognitive-Linquistic Treatment   Treatment focused on Cognition    Skilled Treatment (cognition 10 minutes) Pt is planning a vacation to [place] "I did check the price, by the way  this time," pt said jokingly (referring to prevoius session with this SLP). Pt cont to use memory systems at work and at home for memory deficits which he states assist him moreso than they did last session this SLP saw pt. Louis Vang reported his wife took the  notes with the roofer, which he stated he would have prior to COVID-19. (speech individual) SLP engaged pt in conversation for 20 minutes over a varitety of topics - pt with two anomic episode in which he thought of synonyms both times within 5 seconds. Louis Vang reiterated to SLP that synonym is his "go-to" compensation and then likely description if a noun he is trying to generate a name for. SLP also reminded pt about circumlocution and pt acknowledged understanding.      Assessment / Recommendations / Plan   Plan Continue with current plan of care      Progression Toward Goals   Progression toward goals Progressing toward goals                SLP Short Term Goals - 02/04/21 1411       SLP SHORT TERM GOAL #1   Title Pt will report implementation of 2 memory and attention compensations to increase work performance with occasional min A over 2 sessions    Baseline 02-04-21  Status Partially Met      SLP SHORT TERM GOAL #2   Title Pt will demonstrate ability to alternate and divide attention on structured therapeutic tasks with occasional min A over 2 sessions    Status Partially Met      SLP SHORT TERM GOAL #3   Title Pt will demonstrate ability to problem solve complex scenarios with occasional min A over 2 sessions    Baseline 01-21-21, 02-04-21    Status Achieved              SLP Long Term Goals - 02/19/21 1529       SLP LONG TERM GOAL #1   Title Pt will report implementation of 4 memory and attention compensations to increase work performance with rare min A over 2 sessions    Baseline 02-19-21    Time 4    Period Weeks    Status Partially Met   and ongoing     SLP LONG TERM GOAL #2   Title Pt will demonstrate  ability to alternate and divide attention on structured therapeutic tasks with rare min A over 2 sessions    Time 5    Period Weeks    Status Not Met   and ongoing     SLP LONG TERM GOAL #3   Title Pt will demonstrate ability to problem solve complex scenarios with rare min A over 2 sessions    Time 5    Period Weeks    Status Not Met   and ongoing             Plan - 02/19/21 1529     Clinical Impression Statement "Louis Vang" presents for OPST intervention to address changes in cognitive linguistic functioning s/p COVID-19 in December 2021.  Pt reports improvements with recommended attention/memory techniques which have been effective for full time employment. Pt endorsed intermittent word finding, in which SLP provided education and strategies of strategies and cueing techniques; SLP added aphasia dx. Ongoing education and training conducted re: use of compensations and external memory aids and word finding compensations to optimize language skills, and functional recall and attention during daily living. 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    Speech Therapy Frequency 1x /week   Pt requested every other week   Duration 8 weeks    Treatment/Interventions Compensatory strategies;Cognitive reorganization;Compensatory techniques;Internal/external aids;SLP instruction and feedback;Environmental controls;Functional tasks;Patient/family education    Potential to Achieve Goals Good    SLP Home Exercise Plan provided    Consulted and Agree with Plan of Care Patient             Patient will benefit from skilled therapeutic intervention in order to improve the following deficits and impairments:   Cognitive communication deficit  Aphasia    Problem List Patient Active Problem List   Diagnosis Date Noted   Memory loss 12/09/2020   Nonintractable headache  12/09/2020   Olfactory impairment 12/09/2020   History of mononucleosis 09/23/2020   History of COVID-19 09/23/2020   Chronic diarrhea 09/23/2020   Snoring 09/23/2020   COVID-19 long hauler manifesting chronic fatigue 08/03/2020   COVID-19 long hauler manifesting chronic dyspnea 08/03/2020   COVID-19 long hauler manifesting chronic concentration deficit 08/03/2020    [facility] 02/19/2021, 3:33 PM  [facility] [address] [place], [place], [address] Phone: [phone]   Fax:  [phone]  Name: Louis Vang MRN: [id number] Date of Birth: Nov 03, 1990

## 2021-03-03 ENCOUNTER — Other Ambulatory Visit: Payer: Self-pay

## 2021-03-03 ENCOUNTER — Ambulatory Visit: Payer: PRIVATE HEALTH INSURANCE | Attending: Nurse Practitioner

## 2021-03-03 ENCOUNTER — Ambulatory Visit (HOSPITAL_COMMUNITY): Payer: PRIVATE HEALTH INSURANCE | Admitting: Licensed Clinical Social Worker

## 2021-03-03 DIAGNOSIS — R41841 Cognitive communication deficit: Secondary | ICD-10-CM | POA: Diagnosis present

## 2021-03-03 DIAGNOSIS — R4701 Aphasia: Secondary | ICD-10-CM | POA: Diagnosis present

## 2021-03-03 NOTE — Therapy (Signed)
[facility] [address] [place], [place], [address] Phone: [phone]   Fax:  [phone]  Speech Language Pathology Treatment  Patient Details  Name: Louis Vang MRN: [id number] Date of Birth: [birth year] Referring Provider (SLP): Fenton Foy, NP   Encounter Date: 03/03/2021   End of Session - 03/03/21 1528     Visit Number 7    Number of Visits 9    Date for SLP Re-Evaluation 03/19/21    SLP Start Time 7517    SLP Stop Time  1615    SLP Time Calculation (min) 45 min    Activity Tolerance Patient tolerated treatment well             Past Medical History:  Diagnosis Date   Healing laceration 11/07/2011   web space right hand   Tympanic membrane perforation 10/2011   bilateral    Past Surgical History:  Procedure Laterality Date   MYRINGOPLASTY  age 30   MYRINGOPLASTY W/ PAPER PATCH  11/11/2011   Procedure: MYRINGOPLASTY WITH CIGARETTE PAPER;  Surgeon: Rozetta Nunnery, MD;  Location: [facility];  Service: ENT;  Laterality: Right;   fat graft   TONSILLECTOMY     TYMPANOPLASTY  11/11/2011   Procedure: TYMPANOPLASTY;  Surgeon: Rozetta Nunnery, MD;  Location: [facility];  Service: ENT;  Laterality: Left;   WISDOM TOOTH EXTRACTION      There were no vitals filed for this visit.   Subjective Assessment - 03/03/21 1531     Subjective "I got a promotion. Things are definitely getting better"    Currently in Pain? No/denies                   ADULT SLP TREATMENT - 03/03/21 1528       General Information   Behavior/Cognition Alert;Cooperative;Pleasant mood      Treatment Provided   Treatment provided Cognitive-Linquistic      Cognitive-Linquistic Treatment   Treatment focused on Aphasia;Cognition    Skilled Treatment Pt reports improvements in cognition and anomia, which is occuring less frequently. Pt indicates good use of memory and attention  compensations at home, which have been effective for daily functioning. SLP targeted high level word finding this session, in which pt needed occasional to usual additional processsing time and occasional semantic cues to aid naming for divergent tasks. Goal added to address anomia.      Assessment / Recommendations / Plan   Plan Continue with current plan of care;Goals updated      Progression Toward Goals   Progression toward goals Progressing toward goals              SLP Education - 03/03/21 1600     Education Details anomia, HEP    Person(s) Educated Patient    Methods Explanation;Demonstration;Handout    Comprehension Verbalized understanding;Returned demonstration;Need further instruction              SLP Short Term Goals - 02/04/21 1411       SLP SHORT TERM GOAL #1   Title Pt will report implementation of 2 memory and attention compensations to increase work performance with occasional min A over 2 sessions    Baseline 02-04-21    Status Partially Met      SLP SHORT TERM GOAL #2   Title Pt will demonstrate ability to alternate and divide attention on structured therapeutic tasks with occasional min A over 2 sessions    Status Partially  Met      SLP SHORT TERM GOAL #3   Title Pt will demonstrate ability to problem solve complex scenarios with occasional min A over 2 sessions    Baseline 01-21-21, 02-04-21    Status Achieved              SLP Long Term Goals - 03/03/21 1529       SLP LONG TERM GOAL #1   Title Pt will report implementation of 4 memory and attention compensations to increase work performance with rare min A over 2 sessions    Baseline 02-19-21, 03-03-21    Time --    Period --    Status Achieved      SLP LONG TERM GOAL #2   Title Pt will demonstrate ability to alternate and divide attention on structured therapeutic tasks with rare min A over 2 sessions    Time 3    Period Weeks    Status On-going   ongoing     SLP LONG TERM GOAL #3    Title Pt will demonstrate ability to problem solve complex scenarios with rare min A over 2 sessions    Time 3    Period Weeks    Status On-going   and ongoing     SLP LONG TERM GOAL #4   Title Pt will use anomia strategies on structured tasks and in mod complex conversation with rare min A over 2 sessions    Time 3    Period Weeks    Status New              Plan - 03/03/21 1529     Clinical Impression Statement "[name]" presents for OPST intervention to address changes in cognitive linguistic functioning s/p COVID-19 in December 2021.  Pt reports improvements in cognition and anomia, with use of recommended attention/memory techniques and external aids. Pt endorsed less frequent word finding; however, some persisting anomia endorsed and demonstrated this session. Pt benefited from additional processing time and occasional cues. Ongoing education and training conducted re: word finding and memory compensations to optimize language skills and functional recall and attention during daily living. 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    Speech Therapy Frequency 1x /week   Pt requested every other week   Duration 8 weeks    Treatment/Interventions Compensatory strategies;Cognitive reorganization;Compensatory techniques;Internal/external aids;SLP instruction and feedback;Environmental controls;Functional tasks;Patient/family education    Potential to Achieve Goals Good    SLP Home Exercise Plan provided    Consulted and Agree with Plan of Care Patient             Patient will benefit from skilled therapeutic intervention in order to improve the following deficits and impairments:   Aphasia  Cognitive communication deficit    Problem List Patient Active Problem List   Diagnosis Date Noted   Memory loss 12/09/2020   Nonintractable headache 12/09/2020    Olfactory impairment 12/09/2020   History of mononucleosis 09/23/2020   History of COVID-19 09/23/2020   Chronic diarrhea 09/23/2020   Snoring 09/23/2020   COVID-19 long hauler manifesting chronic fatigue 08/03/2020   COVID-19 long hauler manifesting chronic dyspnea 08/03/2020   COVID-19 long hauler manifesting chronic concentration deficit 08/03/2020    Alinda Deem, MA CCC-SLP 03/03/2021, 4:17 PM  [facility] [address] [place], [place], [address] Phone: [phone]   Fax:  [phone]   Name: Andruw Battie MRN: [id number] Date of Birth: 1991-01-04

## 2021-03-05 ENCOUNTER — Telehealth: Payer: Self-pay

## 2021-03-05 NOTE — Telephone Encounter (Signed)
Referral notes received from [facility], Phone #: [phone]   A copy of the notes have been placed in the scheduling box for check-out to pick-up and to enter referral. Original notes placed in file cabinet.

## 2021-03-15 ENCOUNTER — Ambulatory Visit: Payer: PRIVATE HEALTH INSURANCE | Admitting: Neurology

## 2021-03-19 ENCOUNTER — Other Ambulatory Visit: Payer: Self-pay

## 2021-03-19 ENCOUNTER — Ambulatory Visit: Payer: PRIVATE HEALTH INSURANCE

## 2021-03-19 DIAGNOSIS — R41841 Cognitive communication deficit: Secondary | ICD-10-CM

## 2021-03-19 DIAGNOSIS — R4701 Aphasia: Secondary | ICD-10-CM

## 2021-03-19 NOTE — Patient Instructions (Addendum)
Recommendations:  1) Write down notes during meetings  2) Turn off other monitors 3) Turn off TV  4) Clarifying questions/rephrasing  5) Problem solve when you identified any issue (ex: processing)   Try 2-3 brain apps and be prepared to discuss your favorite and why next ST session

## 2021-03-20 NOTE — Therapy (Signed)
[facility] [address] [place], [place], [address] Phone: [phone]   Fax:  [phone]  Speech Language Pathology Treatment/Recert  Patient Details  Name: Louis Vang MRN: [id number] Date of Birth: [birth year] Referring Provider (SLP): Fenton Foy, NP   Encounter Date: 03/19/2021   End of Session - 03/19/21 1425     Visit Number 8    Number of Visits 16    Date for SLP Re-Evaluation 05/14/21   extended 8 weeks as pt requested every other week   Authorization Type 60 combined    SLP Start Time 1401    SLP Stop Time  1751    SLP Time Calculation (min) 44 min    Activity Tolerance Patient tolerated treatment well             Past Medical History:  Diagnosis Date   Healing laceration 11/07/2011   web space right hand   Tympanic membrane perforation 10/2011   bilateral    Past Surgical History:  Procedure Laterality Date   MYRINGOPLASTY  age 30   MYRINGOPLASTY W/ PAPER PATCH  11/11/2011   Procedure: MYRINGOPLASTY WITH CIGARETTE PAPER;  Surgeon: Rozetta Nunnery, MD;  Location: [facility];  Service: ENT;  Laterality: Right;   fat graft   TONSILLECTOMY     TYMPANOPLASTY  11/11/2011   Procedure: TYMPANOPLASTY;  Surgeon: Rozetta Nunnery, MD;  Location: [facility];  Service: ENT;  Laterality: Left;   WISDOM TOOTH EXTRACTION      There were no vitals filed for this visit.   Subjective Assessment - 03/19/21 1402     Subjective "more headaches"    Currently in Pain? Yes    Pain Score 2     Pain Location Head                   ADULT SLP TREATMENT - 03/19/21 1403       General Information   Behavior/Cognition Alert;Cooperative;Pleasant mood      Treatment Provided   Treatment provided Cognitive-Linquistic      Cognitive-Linquistic Treatment   Treatment focused on Aphasia;Cognition    Skilled Treatment Pt reports increased frequency of headaches.  Pt is currently managing headaches and cognitive fatigue by working on more complex tasks first and taking usual breaks. 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 SLP engaged patient in problem solving for current challenges, including slow processing while driving and during meetings. SLP provided additional recommendations to aid focus and processing. Some word finding reported when constraints present. Pt is utilizing synonyms with good accuracy; however, pt endorses synonyms are ineffective for more technical terms at work. For divergent tasks related to hobbies, pt required rare extended time to ID targeted items.      Assessment / Recommendations / Plan   Plan Continue with current plan of care;Goals updated      Progression Toward Goals   Progression toward goals Progressing toward goals              SLP Education - 03/20/21 1357     Education Details note taking, clarifying questions to aid processing, limit distractions, problem solving for functional scenarios    Person(s) Educated Patient    Methods Explanation;Demonstration    Comprehension Returned demonstration;Need further instruction;Verbalized understanding              SLP Short Term Goals - 02/04/21 1411  SLP SHORT TERM GOAL #1   Title Pt will report implementation of 2 memory and attention compensations to increase work performance with occasional min A over 2 sessions    Baseline 02-04-21    Status Partially Met      SLP SHORT TERM GOAL #2   Title Pt will demonstrate ability to alternate and divide attention on structured therapeutic tasks with occasional min A over 2 sessions    Status Partially Met      SLP SHORT TERM GOAL #3   Title Pt will demonstrate ability to problem solve complex scenarios with occasional min A over 2 sessions    Baseline 01-21-21, 02-04-21    Status Achieved              SLP Long  Term Goals - 03/20/21 1403       SLP LONG TERM GOAL #1   Title Pt will report implementation of 4 memory and attention compensations to increase work performance with rare min A over 2 sessions    Baseline 02-19-21, 03-03-21    Status Achieved      SLP LONG TERM GOAL #2   Title Pt will report ability to effectively alternate and divide attention on work related tasks with use of compensations over 2 sessions    Baseline 03-19-21    Time 8    Period Weeks    Status Revised    Target Date 05/14/21      SLP LONG TERM GOAL #3   Title Pt will demonstrate ability to problem solve complex scenarios related to work and daily living tasks with rare min A over 2 sessions    Baseline 03-19-21    Time 8    Period Weeks    Status Revised    Target Date 05/14/21      SLP LONG TERM GOAL #4   Title Pt will use anomia strategies on structured tasks and in mod complex conversation with rare min A over 2 sessions    Baseline 03-19-21    Time 8    Period Weeks    Status On-going   ongoing   Target Date 05/14/21              Plan - 03/19/21 1427     Clinical Impression Statement "Louis Vang" presents for OPST intervention to address changes in cognitive linguistic functioning s/p COVID-19 in December 2021.  Pt reports slow but steady improvements in cognition and anomia, with use of recommended techniques, strategies, and external aids. Ongoing education and training conducted re: word finding and memory/attention compensations to optimize language skills and functional recall and attention during daily living tasks and work. Pt believes he has not yet met his rehab potential and would benefit from continuation of skilled ST intervention to optimize return to PLOF. 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    Speech Therapy Frequency 1x /week   Pt requested every other week    Duration 8 weeks   extended 8 weeks as pt requested every other week   Treatment/Interventions Compensatory strategies;Cognitive reorganization;Compensatory techniques;Internal/external aids;SLP instruction and feedback;Environmental controls;Functional tasks;Patient/family education    Potential to Achieve Goals Good    SLP Home Exercise Plan provided    Consulted and Agree with Plan of Care Patient             Patient will benefit from skilled therapeutic intervention in order to improve  the following deficits and impairments:   Cognitive communication deficit  Aphasia    Problem List Patient Active Problem List   Diagnosis Date Noted   Memory loss 12/09/2020   Nonintractable headache 12/09/2020   Olfactory impairment 12/09/2020   History of mononucleosis 09/23/2020   History of COVID-19 09/23/2020   Chronic diarrhea 09/23/2020   Snoring 09/23/2020   COVID-19 long hauler manifesting chronic fatigue 08/03/2020   COVID-19 long hauler manifesting chronic dyspnea 08/03/2020   COVID-19 long hauler manifesting chronic concentration deficit 08/03/2020    Alinda Deem, MA CCC-SLP 03/20/2021, 2:11 PM  [facility] [address] [place], [place], [address] Phone: [phone]   Fax:  [phone]   Name: Shmiel Morton MRN: [id number] Date of Birth: 1990-11-08

## 2021-04-02 ENCOUNTER — Ambulatory Visit: Payer: PRIVATE HEALTH INSURANCE | Attending: Nurse Practitioner

## 2021-04-02 ENCOUNTER — Other Ambulatory Visit: Payer: Self-pay

## 2021-04-02 DIAGNOSIS — R41841 Cognitive communication deficit: Secondary | ICD-10-CM | POA: Diagnosis present

## 2021-04-02 NOTE — Therapy (Signed)
[facility] [address] [place], [place], [address] Phone: [phone]   Fax:  [phone]  Speech Language Pathology Treatment/Discharge Summary  Patient Details  Name: Louis Vang MRN: [id number] Date of Birth: 1991/05/06 Referring Provider (SLP): Louis Foy, NP   Encounter Date: 04/02/2021   End of Session - 04/02/21 1411     Visit Number 9    Number of Visits 16    Date for SLP Re-Evaluation 05/14/21    Authorization Type 60 combined    SLP Start Time 1402    SLP Stop Time  1432    SLP Time Calculation (min) 30 min    Activity Tolerance Patient tolerated treatment well             Past Medical History:  Diagnosis Date   Healing laceration 11/07/2011   web space right hand   Tympanic membrane perforation 10/2011   bilateral    Past Surgical History:  Procedure Laterality Date   MYRINGOPLASTY  age 9   MYRINGOPLASTY W/ PAPER PATCH  11/11/2011   Procedure: MYRINGOPLASTY WITH CIGARETTE PAPER;  Surgeon: Louis Nunnery, MD;  Location: [facility];  Service: ENT;  Laterality: Right;   fat graft   TONSILLECTOMY     TYMPANOPLASTY  11/11/2011   Procedure: TYMPANOPLASTY;  Surgeon: Louis Nunnery, MD;  Location: [facility];  Service: ENT;  Laterality: Left;   WISDOM TOOTH EXTRACTION      There were no vitals filed for this visit.   Subjective Assessment - 04/02/21 1404     Subjective "this is the best I've felt so far"    Currently in Pain? No/denies            SPEECH THERAPY DISCHARGE SUMMARY  Visits from Start of Care: 9  Current functional level related to goals / functional outcomes: Pt returned and reported significant improvements in cognitive communication since last ST session. Pt is successfully implementing anomia and cognitive strategies per SLP recommendation, with good success indicated. Pt is pleased with current progress and requested ST  discharge this session.    Remaining deficits: Baseline attention deficit   Education / Equipment: Memory/attention/anomia compensations, functional recommendations and application   Patient agrees to discharge. Patient goals were met. Patient is being discharged due to being pleased with the current functional level..           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 Rare word finding reported and exhibited this session. Pt is using anomia compensations intermittently. Pt is pleased with current progress and requested ST discharge.      Assessment / Recommendations / Plan   Plan Discharge SLP treatment due to (comment)   pt is pleased with current progress     Progression Toward Goals   Progression toward goals Goals met, education completed, patient discharged from [facility] - 04/02/21 1417     Education Details discharge summary, recommendations s/p ST discharge    Person(s) Educated Patient    Methods Explanation;Demonstration;Handout    Comprehension Verbalized understanding;Returned  demonstration;Need further instruction              SLP Short Term Goals - 02/04/21 1411       SLP SHORT TERM GOAL #1   Title Pt will report implementation of 2 memory and attention compensations to increase work performance with occasional min A over 2 sessions    Baseline 02-04-21    Status Partially Met      SLP SHORT TERM GOAL #2   Title Pt will demonstrate ability to alternate and divide attention on structured therapeutic tasks with occasional min A over 2 sessions    Status Partially Met       SLP SHORT TERM GOAL #3   Title Pt will demonstrate ability to problem solve complex scenarios with occasional min A over 2 sessions    Baseline 01-21-21, 02-04-21    Status Achieved              SLP Long Term Goals - 04/02/21 1412       SLP LONG TERM GOAL #1   Title Pt will report implementation of 4 memory and attention compensations to increase work performance with rare min A over 2 sessions    Baseline 02-19-21, 03-03-21    Status Achieved      SLP LONG TERM GOAL #2   Title Pt will report ability to effectively alternate and divide attention on work related tasks with use of compensations over 2 sessions    Baseline 03-19-21, 04-02-21    Status Achieved      SLP LONG TERM GOAL #3   Title Pt will demonstrate ability to problem solve complex scenarios related to work and daily living tasks with rare min A over 2 sessions    Baseline 03-19-21, 04-02-21    Status Achieved      SLP LONG TERM GOAL #4   Title Pt will use anomia strategies on structured tasks and in mod complex conversation with rare min A over 2 sessions    Baseline 03-19-21, 04-02-21    Status Achieved   ongoing             Plan - 04/02/21 1417     Clinical Impression Statement "Louis Vang" presents for OPST intervention to address changes in cognitive linguistic functioning s/p COVID-19 in December 2021. Pt reports significant improvements in cognitive communcation since last ST session. Pt reports improvements in memory, attention, awareness, and problem solving. Pt also indicated less frequent word finding in conversation, with pt intermittently use anomia compensations. Pt is pleased with current progress and requested ST discharge this date.    Treatment/Interventions Compensatory strategies;Cognitive reorganization;Compensatory techniques;Internal/external aids;SLP instruction and feedback;Environmental controls;Functional tasks;Patient/family education    Potential to Achieve Goals Good    SLP Home Exercise  Plan provided    Consulted and Agree with Plan of Care Patient             Patient will benefit from skilled therapeutic intervention in order to improve the following deficits and impairments:   Cognitive communication deficit    Problem List Patient Active Problem List   Diagnosis Date Noted   Memory loss 12/09/2020   Nonintractable headache 12/09/2020   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 [place], [place], [address] Phone: [phone]   Fax:  [phone]   Name: Jamelle Goldston MRN: [id number] Date of Birth: 1991/03/03

## 2021-04-08 ENCOUNTER — Encounter: Payer: Self-pay | Admitting: Neurology

## 2021-04-08 ENCOUNTER — Ambulatory Visit (INDEPENDENT_AMBULATORY_CARE_PROVIDER_SITE_OTHER): Payer: PRIVATE HEALTH INSURANCE | Admitting: Neurology

## 2021-04-08 VITALS — BP 122/82 | HR 88 | Ht 70.0 in | Wt 200.0 lb

## 2021-04-08 DIAGNOSIS — R431 Parosmia: Secondary | ICD-10-CM

## 2021-04-08 DIAGNOSIS — R0609 Other forms of dyspnea: Secondary | ICD-10-CM | POA: Diagnosis not present

## 2021-04-08 DIAGNOSIS — G43109 Migraine with aura, not intractable, without status migrainosus: Secondary | ICD-10-CM | POA: Insufficient documentation

## 2021-04-08 DIAGNOSIS — U099 Post covid-19 condition, unspecified: Secondary | ICD-10-CM

## 2021-04-08 DIAGNOSIS — G9332 Myalgic encephalomyelitis/chronic fatigue syndrome: Secondary | ICD-10-CM

## 2021-04-08 DIAGNOSIS — R41840 Attention and concentration deficit: Secondary | ICD-10-CM | POA: Diagnosis not present

## 2021-04-08 DIAGNOSIS — G441 Vascular headache, not elsewhere classified: Secondary | ICD-10-CM

## 2021-04-08 DIAGNOSIS — G4701 Insomnia due to medical condition: Secondary | ICD-10-CM

## 2021-04-08 MED ORDER — TRAZODONE HCL 50 MG PO TABS: 50.0000 mg | ORAL_TABLET | Freq: Every day | ORAL | 5 refills | Status: DC

## 2021-04-08 MED ORDER — ALPRAZOLAM 0.5 MG PO TABS: 0.5000 mg | ORAL_TABLET | Freq: Every evening | ORAL | 0 refills | Status: DC | PRN

## 2021-04-08 MED ORDER — METOPROLOL TARTRATE 25 MG PO TABS: 25.0000 mg | ORAL_TABLET | Freq: Two times a day (BID) | ORAL | 5 refills | Status: DC

## 2021-04-08 NOTE — Patient Instructions (Addendum)
Assessment:  After physical and neurologic examination, review of laboratory studies, imaging, neurophysiology testing and pre-existing records, assessment :   1) feeling of delayed processing, affecting driving. 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 Needs MRI brain with and without contrast.    2) took vyanse every day before COVID- now off due to insomnia, heart rate. May affect productivity.    3) Migraines. 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  Good sense of smell.  Has cut out caffeine and vyvanse.    4) trying to replace Lunesta, perhaps with Trazodone.          Plan:  Treatment plan and additional workup :   MRI brain ordered.    Topiramate would contribute to word finding delay- caveat.Tegretol can help with aura.   Beta blocker- helping tachycardia and can reduce migraine.  Lets start here.    Trazodone 50 mg tab at night, will replace ( hopefully Lunesta and Lexapro) .        Metoprolol Extended-Release Tablets What is this medication? METOPROLOL (me TOE proe lole) treats high blood pressure and heart failure. It may also be used to prevent chest pain (angina). It works by lowering your blood pressure and heart rate, making it easier for your heart to pump blood to the rest of your body. It belongs to a group of medications called beta blockers. This medicine may be used for other purposes; ask your health care provider or pharmacist if you have questions. COMMON BRAND NAME(S): toprol, Toprol XL What should I tell my care team before I take this medication? They need to know if you have any of these conditions: Diabetes Heart or vessel disease like slow heart rate, worsening heart failure, heart block, sick sinus syndrome, or Raynaud's disease Kidney disease Liver disease Lung or breathing disease, like asthma or emphysema Pheochromocytoma Thyroid disease An unusual or allergic reaction to metoprolol, other beta blockers, medications, foods, dyes, or preservatives Pregnant or  trying to get pregnant Breast-feeding How should I use this medication? Take this medication by mouth. Take it as directed on the prescription label at the same time every day. Take it with food. You may cut the tablet in half if it is scored (has a line in the middle of it). This may help you swallow the tablet if the whole tablet is too big. Be sure to take both halves. Do not take just one-half of the tablet. Keep taking it unless your care team tells you to stop. Talk to your care team about the use of this medication in children. While it may be prescribed for children as young as 6 years for selected conditions, precautions do apply. Overdosage: If you think you have taken too much of this medicine contact a poison control center or emergency room at once. NOTE: This medicine is only for you. Do not share this medicine with others. What if I miss a dose? If you miss a dose, take it as soon as you can. If it is almost time for your next dose, take only that dose. Do not take double or extra doses. What may interact with this medication? This medication may interact with the following: Certain medications for blood pressure, heart disease, irregular heartbeat Certain medications for depression, like monoamine oxidase (MAO) inhibitors, fluoxetine, or paroxetine Clonidine Dobutamine Epinephrine Isoproterenol Reserpine This list may not describe all possible interactions. Give your health care provider a list of all the medicines, herbs, non-prescription drugs, or dietary supplements you use. Also tell them if you  smoke, drink alcohol, or use illegal drugs. Some items may interact with your medicine. What should I watch for while using this medication? Visit your care team for regular checks on your progress. Check your blood pressure as directed. Ask your care team what your blood pressure should be. Also, find out when you should contact them. Do not treat yourself for coughs, colds, or pain  while you are using this medication without asking your care team for advice. Some medications may increase your blood pressure. You may get drowsy or dizzy. Do not drive, use machinery, or do anything that needs mental alertness until you know how this medication affects you. Do not stand up or sit up quickly, especially if you are an older patient. This reduces the risk of dizzy or fainting spells. Alcohol may interfere with the effect of this medication. Avoid alcoholic drinks. This medication may increase blood sugar. Ask your care team if changes in diet or medications are needed if you have diabetes. What side effects may I notice from receiving this medication? Side effects that you should report to your care team as soon as possible: Allergic reactions-skin rash, itching, hives, swelling of the face, lips, tongue, or throat Heart failure-shortness of breath, swelling of the ankles, feet, or hands, sudden weight gain, unusual weakness or fatigue Low blood pressure-dizziness, feeling faint or lightheaded, blurry vision Raynaud's-cool, numb, or painful fingers or toes that may change color from pale, to blue, to red Slow heartbeat-dizziness, feeling faint or lightheaded, confusion, trouble breathing, unusual weakness or fatigue Worsening mood, feelings of depression Side effects that usually do not require medical attention (report to your care team if they continue or are bothersome): Change in sex drive or performance Diarrhea Dizziness Fatigue Headache This list may not describe all possible side effects. Call your doctor for medical advice about side effects. You may report side effects to FDA at 1-800-FDA-1088. Where should I keep my medication? Keep out of the reach of children and pets. Store at room temperature between 20 and 25 degrees C (68 and 77 degrees F). Throw away any unused medication after the expiration date. NOTE: This sheet is a summary. It may not cover all possible  information. If you have questions about this medicine, talk to your doctor, pharmacist, or health care provider.  2022 Elsevier/Gold Standard (2020-07-16 12:55:47) Trazodone Tablets What is this medication? TRAZODONE (TRAZ oh done) treats depression. It increases the amount of serotonin in the brain, a hormone that helps regulate mood. This medicine may be used for other purposes; ask your health care provider or pharmacist if you have questions. COMMON BRAND NAME(S): Desyrel What should I tell my care team before I take this medication? They need to know if you have any of these conditions: Attempted suicide or thinking about it Bipolar disorder Bleeding problems Glaucoma Heart disease, or previous heart attack Irregular heart beat Kidney or liver disease Low levels of sodium in the blood An unusual or allergic reaction to trazodone, other medications, foods, dyes or preservatives Pregnant or trying to get pregnant Breast-feeding How should I use this medication? Take this medication by mouth with a glass of water. Follow the directions on the prescription label. Take this medication shortly after a meal or a light snack. Take your medication at regular intervals. Do not take your medication more often than directed. Do not stop taking this medication suddenly except upon the advice of your care team. Stopping this medication too quickly may cause serious side effects  or your condition may worsen. A special MedGuide will be given to you by the pharmacist with each prescription and refill. Be sure to read this information carefully each time. Talk to your care team regarding the use of this medication in children. Special care may be needed. Overdosage: If you think you have taken too much of this medicine contact a poison control center or emergency room at once. NOTE: This medicine is only for you. Do not share this medicine with others. What if I miss a dose? If you miss a dose, take  it as soon as you can. If it is almost time for your next dose, take only that dose. Do not take double or extra doses. What may interact with this medication? Do not take this medication with any of the following: Certain medications for fungal infections like fluconazole, itraconazole, ketoconazole, posaconazole, voriconazole Cisapride Dronedarone Linezolid MAOIs like Carbex, Eldepryl, Marplan, Nardil, and Parnate Mesoridazine Methylene blue (injected into a vein) Pimozide Saquinavir Thioridazine This medication may also interact with the following: Alcohol Antiviral medications for HIV or AIDS Aspirin and aspirin-like medications Barbiturates like phenobarbital Certain medications for blood pressure, heart disease, irregular heart beat Certain medications for depression, anxiety, or psychotic disturbances Certain medications for migraine headache like almotriptan, eletriptan, frovatriptan, naratriptan, rizatriptan, sumatriptan, zolmitriptan Certain medications for seizures like carbamazepine and phenytoin Certain medications for sleep Certain medications that treat or prevent blood clots like dalteparin, enoxaparin, warfarin Digoxin Fentanyl Lithium NSAIDS, medications for pain and inflammation, like ibuprofen or naproxen Other medications that prolong the QT interval (cause an abnormal heart rhythm) like dofetilide Rasagiline Supplements like St. John's wort, kava kava, valerian Tramadol Tryptophan This list may not describe all possible interactions. Give your health care provider a list of all the medicines, herbs, non-prescription drugs, or dietary supplements you use. Also tell them if you smoke, drink alcohol, or use illegal drugs. Some items may interact with your medicine. What should I watch for while using this medication? Tell your care team if your symptoms do not get better or if they get worse. Visit your care team for regular checks on your progress. Because it  may take several weeks to see the full effects of this medication, it is important to continue your treatment as prescribed by your care team. Watch for new or worsening thoughts of suicide or depression. This includes sudden changes in mood, behaviors, or thoughts. These changes can happen at any time but are more common in the beginning of treatment or after a change in dose. Call your care team right away if you experience these thoughts or worsening depression. Manic episodes may happen in patients with bipolar disorder who take this medication. Watch for changes in feelings or behaviors such as feeling anxious, nervous, agitated, panicky, irritable, hostile, aggressive, impulsive, severely restless, overly excited and hyperactive, or trouble sleeping. These changes can happen at any time but are more common in the beginning of treatment or after a change in dose. Call your care team right away if you notice any of these symptoms. You may get drowsy or dizzy. Do not drive, use machinery, or do anything that needs mental alertness until you know how this medication affects you. Do not stand or sit up quickly, especially if you are an older patient. This reduces the risk of dizzy or fainting spells. Alcohol may interfere with the effect of this medication. Avoid alcoholic drinks. This medication may cause dry eyes and blurred vision. If you wear contact lenses  you may feel some discomfort. Lubricating drops may help. See your eye doctor if the problem does not go away or is severe. Your mouth may get dry. Chewing sugarless gum, sucking hard candy and drinking plenty of water may help. Contact your care team if the problem does not go away or is severe. What side effects may I notice from receiving this medication? Side effects that you should report to your care team as soon as possible: Allergic reactions-skin rash, itching, hives, swelling of the face, lips, tongue, or throat Bleeding-bloody or black,  tar-like stools, red or dark brown urine, vomiting blood or brown material that looks like coffee grounds, small, red or purple spots on skin, unusual bleeding or bruising Heart rhythm changes-fast or irregular heartbeat, dizziness, feeling faint or lightheaded, chest pain, trouble breathing Low blood pressure-dizziness, feeling faint or lightheaded, blurry vision Low sodium level-muscle weakness, fatigue, dizziness, headache, confusion Prolonged or painful erection Serotonin syndrome-irritability, confusion, fast or irregular heartbeat, muscle stiffness, twitching muscles, sweating, high fever, seizures, chills, vomiting, diarrhea Sudden eye pain or change in vision such as blurry vision, seeing halos around lights, vision loss Thoughts of suicide or self-harm, worsening mood, feelings of depression Side effects that usually do not require medical attention (report to your care team if they continue or are bothersome): Change in sex drive or performance Constipation Dizziness Drowsiness Dry mouth This list may not describe all possible side effects. Call your doctor for medical advice about side effects. You may report side effects to FDA at 1-800-FDA-1088. Where should I keep my medication? Keep out of the reach of children and pets. Store at room temperature between 15 and 30 degrees C (59 to 86 degrees F). Protect from light. Keep container tightly closed. Throw away any unused medication after the expiration date. NOTE: This sheet is a summary. It may not cover all possible information. If you have questions about this medicine, talk to your doctor, pharmacist, or health care provider.  2022 Elsevier/Gold Standard (2020-05-04 14:46:11)

## 2021-04-08 NOTE — Progress Notes (Signed)
[facility]  Provider:  Dr Laiah Vang Referring Provider: Ivonne Andrew, NP Primary Care Physician:  Louis Canterbury, MD  Chief Complaint  Patient presents with   Follow-up    Pt alone, rm 1. Presents today. Had covid last December. Prior to covid never had history of migraines. He states that when he made the apt in April he was having headaches/migraines 4/5 days out of the week.  What typically would happen is he would develop light/noise sensitivity. He would sometimes have dizziness or nausea with them and they have a metallic taste prior to the headache coming on. He would have to lay down.    Other    These have gotten better more recently. Has not had any imaging on the brain. Has not started on anytime of prevention management.     HPI:  Louis Vang is a 30 y.o. 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 Insomnia .  Louis Vang reports that he contracted COVID-19 in the year 2021 around 20 December.  He felt since that he had sometimes not sometimes numbness and tingling in the extremities, there were memory and cognitive issues as well long-term and he has completed a speech therapy planning.  Cognitive behavioral therapy has been implemented as well.   He is here today because since his COVID infection he has a recurrent olfactory aura before he has a migrainous headache. He states that he has at least 2 migrainous events still per month but they are less frequent than they used to be.    He also had sleep problems ever since COVID and he has been placed on Lunesta.  He states that if he were to take [facility] and had more nights with insomnia problems, he likely would experience more headaches as well.  The patient is a former cigarette smoker but has not smoked in the last 2.5 years.  His BMI is 24.9, he presents with normal blood pressure and normal heart rate.  He had tachycardia and especially high heart rates  while  in a shower, reports that exercise intolerance still present, palpitations at night, SOB at night lasted for 3 moth post infection.  Post - Covid Anxiety was high- started on lexapro.   He was vaccinated with Moderna - 2 shots.  No boosters .  Olfactory aura of a smell of metal- like steel, not associated with any taste.     Review of Systems: Out of a complete 14 system review, the patient complains of only the following symptoms, and all other reviewed systems are negative  Insomnia, tachycardia.   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  Has had a sleep study, negative for apnea. No cardiac stress tests, EKG, chest x ray.  No Echo.   Social History   Socioeconomic History   Marital status: Married    Spouse name: Not on file   Number of children: Not on file   Years of education: Not on file   Highest education level: Not on file  Occupational History   Not on file  Tobacco Use   Smoking status: Former    Years: 2.00    Types: Cigarettes   Smokeless tobacco: Never   Tobacco comments:    5 cig./week  Substance and Sexual Activity   Alcohol use: Yes  Comment: 2-3 x/week   Drug use: No   Sexual activity: Not on file  Other Topics Concern   Not on file  Social History Narrative   Not on file   Social Determinants of Health   Financial Resource Strain: Not on file  Food Insecurity: Not on file  Transportation Needs: Not on file  Physical Activity: Not on file  Stress: Not on file  Social Connections: Not on file  Intimate Partner Violence: Not on file    No family history on file.  Past Medical History:  Diagnosis Date   Healing laceration 11/07/2011   web space right hand   Tympanic membrane perforation 10/2011   bilateral    Past Surgical History:  Procedure Laterality Date   MYRINGOPLASTY  age 63    MYRINGOPLASTY W/ PAPER PATCH  11/11/2011   Procedure: MYRINGOPLASTY WITH CIGARETTE PAPER;  Surgeon: Drema Halon, MD;  Location: [facility];  Service: ENT;  Laterality: Right;   fat graft   TONSILLECTOMY     TYMPANOPLASTY  11/11/2011   Procedure: TYMPANOPLASTY;  Surgeon: Drema Halon, MD;  Location: [facility];  Service: ENT;  Laterality: Left;   WISDOM TOOTH EXTRACTION      Current Outpatient Medications  Medication Sig Dispense Refill   escitalopram (LEXAPRO) 10 MG tablet Take 10 mg by mouth daily.     eszopiclone (LUNESTA) 2 MG TABS tablet Take 2 mg by mouth at bedtime.     famotidine (PEPCID) 20 MG tablet Take 20 mg by mouth 2 (two) times daily.     loratadine (CLARITIN) 10 MG tablet Take 10 mg by mouth daily.     No current facility-administered medications for this visit.    Allergies as of 04/08/2021 - Review Complete 04/02/2021  Allergen Reaction Noted   Antihistamines, diphenhydramine-type  07/01/2020   Hydroxyzine hcl Other (See Comments) 07/16/2020    Vitals: BP 122/82   Pulse 88   Ht 5\' 10"  (1.778 m)   BMI 24.97 kg/m  Last Weight:  Wt Readings from Last 1 Encounters:  10/19/20 174 lb (78.9 kg)   Last Height:   Ht Readings from Last 1 Encounters:  04/08/21 5\' 10"  (1.778 m)   Vision Screening: deferred.  Physical exam:  General: The patient is awake, alert and appears not in acute distress. The patient is well groomed. Head: Normocephalic, atraumatic. Neck is supple. Mallampati2, neck circumference:18 Cardiovascular:  Regular rate and rhythm, without  murmurs or carotid bruit, and without distended neck veins. Respiratory: Lungs are clear to auscultation. Skin:  Without evidence of edema, or rash    Neurologic exam : The patient is awake and alert, oriented to place and time.  Memory subjective  described as impaired since COVID- word finding problems, he finished ST- There is a normal attention span &  concentration ability. Speech is fluent without  dysarthria, dysphonia or aphasia. Mood and affect are appropriate.  Cranial nerves:  Smell test :   Able to identify: Lemon oil by scent only.Grapefruit - 'like an orange", coconut and vanilla were identified.  Peppermint - identified. Lavender identified.   Unable to identify: almond " like vanilla" .     Pupils are equal and briskly reactive to light. Funduscopic exam deferred. Extraocular movements  in vertical and horizontal planes intact and without nystagmus.  Visual fields by finger perimetry are intact.  Hearing to finger is impaired, tinnitus, right ea high pitched sound.  Pulsatory in left ear.  Facial sensation intact to fine touch. Facial motor strength is symmetric and tongue and uvula move midline.  Motor exam:   Normal tone and normal muscle bulk and symmetric normal strength in all extremities.  Sensory:  Fine touch, pinprick and vibration were tested in all extremities. Proprioception is tested in the upper extremities only. This was  normal.  Coordination: Rapid alternating movements in the fingers/hands is tested and normal. Finger-to-nose maneuver tested and normal without evidence of ataxia, dysmetria or tremor.  Gait and station: Patient walks without assistive device and is able and assisted stool climb up to the exam table. Strength within normal limits. Stance is stable and normal turns with 3 Steps are unfragmented. Romberg testing is normal.  Deep tendon reflexes: in the  upper and lower extremities are symmetric and intact. Babinski maneuver response is  downgoing.   Assessment:  After physical and neurologic examination, review of laboratory studies, imaging, neurophysiology testing and pre-existing records, assessment :  1) feeling of delayed processing, affecting driving. 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 Needs MRI brain with and without contrast.   2) took vyanse every day before COVID- now off due to  insomnia, heart rate. May affect productivity.   3) Migraines. 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  Good sense of smell.  Has cut out caffeine and vyvanse.   4) trying to replace Lunesta, perhaps with Trazodone.      Plan:  Treatment plan and additional workup :  MRI brain ordered.   Topiramate would contribute to word finding delay- caveat.Tegretol can help with aura.  Beta blocker- helping tachycardia and can reduce migraine.  Lets start here.   Trazodone 50 mg tab at night, will replace ( hopefully Lunesta and Lexapro) .

## 2021-04-08 NOTE — Addendum Note (Signed)
Addended by: Melvyn Novas on: 04/08/2021 03:08 PM   Modules accepted: Orders

## 2021-04-09 LAB — COMPREHENSIVE METABOLIC PANEL
ALT: 19 IU/L (ref 0–44)
AST: 17 IU/L (ref 0–40)
Albumin/Globulin Ratio: 1.7 (ref 1.2–2.2)
Albumin: 4.9 g/dL (ref 4.1–5.2)
Alkaline Phosphatase: 112 IU/L (ref 44–121)
BUN/Creatinine Ratio: 14 (ref 9–20)
BUN: 14 mg/dL (ref 6–20)
Bilirubin Total: 0.3 mg/dL (ref 0.0–1.2)
CO2: 21 mmol/L (ref 20–29)
Calcium: 9.3 mg/dL (ref 8.7–10.2)
Chloride: 100 mmol/L (ref 96–106)
Creatinine, Ser: 0.99 mg/dL (ref 0.76–1.27)
Globulin, Total: 2.9 g/dL (ref 1.5–4.5)
Glucose: 95 mg/dL (ref 70–99)
Potassium: 4.4 mmol/L (ref 3.5–5.2)
Sodium: 137 mmol/L (ref 134–144)
Total Protein: 7.8 g/dL (ref 6.0–8.5)
eGFR: 105 mL/min/{1.73_m2} (ref >59)

## 2021-04-09 NOTE — Progress Notes (Signed)
Normal CMET, good to go for MRI.

## 2021-04-19 ENCOUNTER — Telehealth: Payer: Self-pay | Admitting: Neurology

## 2021-04-19 DIAGNOSIS — Z1389 Encounter for screening for other disorder: Secondary | ICD-10-CM

## 2021-04-19 NOTE — Addendum Note (Signed)
Addended by: Arther Abbott on: 04/19/2021 02:51 PM   Modules accepted: Orders

## 2021-04-19 NOTE — Telephone Encounter (Signed)
Thank you :)

## 2021-04-19 NOTE — Telephone Encounter (Signed)
MR Brain w/wo contrast Dr. Vickey Huger [facility]: NPR ref # [id number]. Patient is scheduled at [facility] for 04/27/21.  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 I stated he would need to have an xray of the eyes done before he has his MRI. He can walk into [facility] any me between 8 am & 4pm to get it done before his appointment. Can you order an DG orbits thank you.

## 2021-04-27 ENCOUNTER — Other Ambulatory Visit: Payer: PRIVATE HEALTH INSURANCE

## 2021-04-27 NOTE — Telephone Encounter (Signed)
Patient had to r/s his MRI due to he has not had the xray of his eyes yet. He is r/s for 05/11/21.

## 2021-05-01 ENCOUNTER — Other Ambulatory Visit: Payer: Self-pay | Admitting: Neurology

## 2021-05-10 ENCOUNTER — Other Ambulatory Visit: Payer: Self-pay

## 2021-05-10 ENCOUNTER — Other Ambulatory Visit: Payer: Self-pay | Admitting: Neurology

## 2021-05-10 ENCOUNTER — Ambulatory Visit
Admission: RE | Admit: 2021-05-10 | Discharge: 2021-05-10 | Disposition: A | Discharge status: Home / Self Care | Payer: PRIVATE HEALTH INSURANCE | Source: Ambulatory Visit | Attending: Neurology | Admitting: Neurology

## 2021-05-10 DIAGNOSIS — Z1389 Encounter for screening for other disorder: Secondary | ICD-10-CM

## 2021-05-10 DIAGNOSIS — Z0189 Encounter for other specified special examinations: Secondary | ICD-10-CM

## 2021-05-10 NOTE — Progress Notes (Signed)
No foreign body found in orbit

## 2021-05-11 ENCOUNTER — Ambulatory Visit (INDEPENDENT_AMBULATORY_CARE_PROVIDER_SITE_OTHER): Payer: PRIVATE HEALTH INSURANCE

## 2021-05-11 DIAGNOSIS — G9332 Myalgic encephalomyelitis/chronic fatigue syndrome: Secondary | ICD-10-CM

## 2021-05-11 DIAGNOSIS — G441 Vascular headache, not elsewhere classified: Secondary | ICD-10-CM

## 2021-05-11 DIAGNOSIS — G4701 Insomnia due to medical condition: Secondary | ICD-10-CM

## 2021-05-11 DIAGNOSIS — R431 Parosmia: Secondary | ICD-10-CM

## 2021-05-11 DIAGNOSIS — U099 Post covid-19 condition, unspecified: Secondary | ICD-10-CM

## 2021-05-11 DIAGNOSIS — G43109 Migraine with aura, not intractable, without status migrainosus: Secondary | ICD-10-CM

## 2021-05-11 DIAGNOSIS — R41840 Attention and concentration deficit: Secondary | ICD-10-CM

## 2021-05-11 DIAGNOSIS — R0609 Other forms of dyspnea: Secondary | ICD-10-CM

## 2021-05-11 MED ORDER — GADOBENATE DIMEGLUMINE 529 MG/ML IV SOLN
20.0000 mL | Freq: Once | INTRAVENOUS | Status: AC | PRN
  Administered 2021-05-11: 20 mL via INTRAVENOUS

## 2021-05-13 NOTE — Progress Notes (Signed)
This MRI was read as normal and did not give any evidence of causes for the patient's symptoms.  No stroke, no MS and no infection noted.

## 2021-06-28 NOTE — Progress Notes (Signed)
ELECTROPHYSIOLOGY CONSULT NOTE  Patient ID: Louis Vang, MRN: [id number], DOB/AGE: Nov 02, 1990 30 y.o. Admit date: (Not on file) Date of Consult: 06/30/2021  Primary Physician: Janece Canterbury, MD Primary Cardiologist: new      Louis Vang is a 31 y.o. male who is being seen today for the evaluation of post-COVID symptoms at the request of Dr. Alver Fisher.    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  He has been followed by Dr. Porfirio Mylar Dohmeier who treated him with metoprolol a few months ago which he is described as life-changing.  It is not clear to me to what degree he had tachycardia although he describes some the heart rates of 150.  He also notes that sometimes lying in bed his heart rate was 100.  He denies heat intolerance or shower intolerance.  He is able to climb stairs at least now without significant difficulty multiple times a day without tachy palpitations.  He is not using alcohol or marijuana.  He has recurrent episodes of chest pain which are pleuritic and involve his parasternal area primarily.  He also had a "popping sensation "with deep inspiration post-COVID which is now resolved.   Date Cr K Hgb  10/22 0.99 4.4 15.6            Past Medical History:  Diagnosis Date   ADHD    COVID    Diarrhea    GAD (generalized anxiety disorder)    GERD (gastroesophageal reflux disease)    Healing laceration 11/07/2011   web space right hand   Insomnia    Jock itch    Tinea cruris    Tympanic membrane perforation 10/26/2011   bilateral      Surgical History:  Past Surgical History:  Procedure Laterality Date   MYRINGOPLASTY  age 58   MYRINGOPLASTY W/ PAPER PATCH  11/11/2011   Procedure: MYRINGOPLASTY WITH  CIGARETTE PAPER;  Surgeon: Drema Halon, MD;  Location: [facility];  Service: ENT;  Laterality: Right;   fat graft   TONSILLECTOMY     TYMPANOPLASTY  11/11/2011   Procedure: TYMPANOPLASTY;  Surgeon: Drema Halon, MD;  Location: [facility];  Service: ENT;  Laterality: Left;   WISDOM TOOTH EXTRACTION       Home Meds: Current Meds  Medication Sig   famotidine (PEPCID) 20 MG tablet Take 20 mg by mouth 2 (two) times daily.   metoprolol tartrate (LOPRESSOR) 25 MG tablet Take 1 tablet (25 mg total) by mouth 2 (two) times daily.   traZODone (DESYREL) 50 MG tablet TAKE 1 TABLET BY MOUTH EVERYDAY AT BEDTIME    Allergies:  Allergies  Allergen Reactions   Antihistamines, Diphenhydramine-Type    Hydroxyzine Hcl Other (See Comments)    Numbness and tingling in extremities    Social History   Socioeconomic History   Marital status: Married    Spouse name: Not on file   Number of children: Not on file   Years of education: Not on file   Highest education level: Not on file  Occupational History   Not on file  Tobacco Use   Smoking status: Former    Years: 2.00    Types: Cigarettes   Smokeless tobacco: Never  Tobacco comments:    5 cig./week  Substance and Sexual Activity   Alcohol use: Yes    Comment: 2-3 x/week   Drug use: No   Sexual activity: Not on file  Other Topics Concern   Not on file  Social History Narrative   Not on file   Social Determinants of Health   Financial Resource Strain: Not on file  Food Insecurity: Not on file  Transportation Needs: Not on file  Physical Activity: Not on file  Stress: Not on file  Social Connections: Not on file  Intimate Partner Violence: Not on file     History reviewed. No pertinent family history.   ROS:  Please see the history of present illness.     All other systems reviewed and negative.    Physical Exam:  Height 5\' 10"  (1.778 m), weight 215 lb 3.2 oz (97.6 kg), SpO2 96  %. General: Well developed, well nourished male in no acute distress. Head: Normocephalic, atraumatic, sclera non-icteric, no xanthomas, nares are without discharge. EENT: normal  Lymph Nodes:  none Neck: Negative for carotid bruits. JVD not elevated. Back:without scoliosis kyphosis Lungs: Clear bilaterally to auscultation without wheezes, rales, or rhonchi. Breathing is unlabored. Heart: RRR with S1 S2. No  /6 systolic murmur . No rubs, or gallops appreciated. Abdomen: Soft, non-tender, non-distended with normoactive bowel sounds. No hepatomegaly. No rebound/guarding. No obvious abdominal masses. Msk:  Strength and tone appear normal for age. Extremities: No clubbing or cyanosis. No  edema.  Distal pedal pulses are 2+ and equal bilaterally. Skin: Warm and Dry Neuro: Alert and oriented X 3. CN III-XII intact Grossly normal sensory and motor function . Psych:  Responds to questions appropriately with a normal affect.        EKG: sinus @ 66 15/09/38   Assessment and Plan:  COVID and secondary debility improving  Deconditioning    The patient suffered severe post COVID sequelae primarily Headaches and abdominal pain with which he found himself largely bed bound for about 3 months with secondary deconditioning.  His recorded High HR and tachypalpitations have no objective reflection today, albeit recognized that he is on metoprolol.    His symptoms, in terms of such things as orthostatic and heat intolerance are not suggestive of dysautonomia, not even post COVID and suspect much is related to  deconditioning  He is vastly improved udner the care of his MDs, esp CDohmeier-- and would recommend that he continue the metoprolol at current doses for 3-6  months, if without untoward and then wean over 3-6 months, ie 25 bid x 2-3 months, and then daily  Also advised him to process together with his new bride somehow the challenges which they have endured this year    17/09/38

## 2021-06-29 ENCOUNTER — Encounter: Payer: Self-pay | Admitting: Internal Medicine

## 2021-06-29 ENCOUNTER — Ambulatory Visit (INDEPENDENT_AMBULATORY_CARE_PROVIDER_SITE_OTHER): Payer: PRIVATE HEALTH INSURANCE | Admitting: Internal Medicine

## 2021-06-29 ENCOUNTER — Other Ambulatory Visit: Payer: Self-pay

## 2021-06-29 VITALS — Ht 70.0 in | Wt 215.2 lb

## 2021-06-29 DIAGNOSIS — Z8616 Personal history of COVID-19: Secondary | ICD-10-CM | POA: Diagnosis not present

## 2021-06-29 DIAGNOSIS — R000 Tachycardia, unspecified: Secondary | ICD-10-CM | POA: Diagnosis not present

## 2021-06-29 NOTE — Progress Notes (Signed)
Delete  See separate note

## 2021-06-29 NOTE — Patient Instructions (Signed)

## 2021-07-02 ENCOUNTER — Encounter: Payer: Self-pay | Admitting: Neurology

## 2021-07-04 ENCOUNTER — Other Ambulatory Visit: Payer: Self-pay | Admitting: Neurology

## 2021-07-08 ENCOUNTER — Other Ambulatory Visit: Payer: Self-pay | Admitting: Neurology

## 2021-08-08 ENCOUNTER — Other Ambulatory Visit: Payer: Self-pay | Admitting: Neurology

## 2021-08-19 ENCOUNTER — Ambulatory Visit (INDEPENDENT_AMBULATORY_CARE_PROVIDER_SITE_OTHER): Payer: PRIVATE HEALTH INSURANCE | Admitting: Family Medicine

## 2021-08-19 ENCOUNTER — Telehealth: Payer: Self-pay | Admitting: *Deleted

## 2021-08-19 ENCOUNTER — Encounter: Payer: Self-pay | Admitting: Family Medicine

## 2021-08-19 VITALS — BP 127/88 | HR 92 | Ht 70.0 in | Wt 209.5 lb

## 2021-08-19 DIAGNOSIS — G4701 Insomnia due to medical condition: Secondary | ICD-10-CM | POA: Diagnosis not present

## 2021-08-19 DIAGNOSIS — R431 Parosmia: Secondary | ICD-10-CM

## 2021-08-19 DIAGNOSIS — G43109 Migraine with aura, not intractable, without status migrainosus: Secondary | ICD-10-CM | POA: Diagnosis not present

## 2021-08-19 MED ORDER — METOPROLOL SUCCINATE ER 25 MG PO TB24: 25.0000 mg | ORAL_TABLET | Freq: Every day | ORAL | 3 refills | Status: DC

## 2021-08-19 MED ORDER — NURTEC 75 MG PO TBDP: 75.0000 mg | ORAL_TABLET | Freq: Every day | ORAL | 11 refills | Status: AC | PRN

## 2021-08-19 NOTE — Patient Instructions (Signed)
Below is our plan:  We will change metoprolol dosing to an extended release tablet. This will continue to provide 25mg  daily but will be delivered slower and usually is better tolerated. Monitor side effects closely with starting Wellbutrin. Continue trazodone and Lunesta per PCP instructions. We will try Nurtec for migraine abortion.   Please make sure you are staying well hydrated. I recommend 50-60 ounces daily. Well balanced diet and regular exercise encouraged. Consistent sleep schedule with 6-8 hours recommended.   Please continue follow up with care team as directed.   Follow up with me in 6 months   You may receive a survey regarding today's visit. I encourage you to leave honest feed back as I do use this information to improve patient care. Thank you for seeing me today!

## 2021-08-19 NOTE — Progress Notes (Signed)
Chief Complaint  Patient presents with   Follow-up    Rm 1, alone. Here for 4 month migraine f/u. Pt overall doing well. Taking trazodone 100mg , alternating w Lunesta.      HISTORY OF PRESENT ILLNESS:  08/19/21 ALL:  Louis Vang is a 31 y.o. male here today for follow up for migraines. He was started on trazodone 50mg  QHS (to replace Lunesta) and metoprolol 25mg  BID for migraine prevention and insomnia. MRI was normal. Since, he is doing better. He reports daily headaches are improving. He has had two migraines since July 2022. He had an event in January. He had gone for a run on Wednesday. The next day he felt a tension type pain of his head. For the next 4-5 days he reports less head pain but more body pain, light sensitivity, nausea and fatigue. This happens every time he tries to exercise. He was seen by August 2022, MD with [facility] in [place] and diagnosed with chronic fatigue syndrome. He was started on Wellbutrin and restarted Vyvanse. PCP increased trazodone to 100mg  nightly and switched Lunesta to PRN. He is tolerating these medicaitons. He reports that his heart rate has been low when standing and has had some dizziness. He was advised to consider decreasing metoprolol dose. No olfactory aura recently. He would like abortive med for migrainous headaches.   HISTORY (copied from Dr Dohmeier's previous note)  HPI:  Louis Vang is a 31 y.o. male here as a referral from  [place] for olfactory aura with migraines. POST COVID. Insomnia .  Louis Vang reports that he contracted COVID-19 in the year 2021 around 20 December.  He felt since that he had sometimes not sometimes numbness and tingling in the extremities, there were memory and cognitive issues as well long-term and he has completed a speech therapy planning.  Cognitive behavioral therapy has been implemented as well.   He is here today because since his COVID infection he has a recurrent olfactory aura  before he has a migrainous headache. He states that he has at least 2 migrainous events still per month but they are less frequent than they used to be.     He also had sleep problems ever since COVID and he has been placed on Lunesta.  He states that if he were to take [facility] and had more nights with insomnia problems, he likely would experience more headaches as well.  The patient is a former cigarette smoker but has not smoked in the last 2.5 years.  His BMI is 24.9, he presents with normal blood pressure and normal heart rate.   He had tachycardia and especially high heart rates  while in a shower, reports that exercise intolerance still present, palpitations at night, SOB at night lasted for 3 moth post infection.  Post - Covid Anxiety was high- started on lexapro.    He was vaccinated with Moderna - 2 shots.  No boosters .   Olfactory aura of a smell of metal- like steel, not associated with any taste.   REVIEW OF SYSTEMS: Out of a complete 14 system review of symptoms, the patient complains only of the following symptoms, headaches, generalized fatigue, and all other reviewed systems are negative.   ALLERGIES: Allergies  Allergen Reactions   Antihistamines, Diphenhydramine-Type    Hydroxyzine Hcl Other (See Comments)    Numbness and tingling in extremities     HOME MEDICATIONS: Outpatient Medications Prior to Visit  Medication Sig Dispense Refill   eszopiclone (  LUNESTA) 2 MG TABS tablet Take 2 mg by mouth at bedtime as needed.     famotidine (PEPCID) 20 MG tablet Take 20 mg by mouth 2 (two) times daily.     traZODone (DESYREL) 50 MG tablet TAKE 1 TABLET BY MOUTH EVERYDAY AT BEDTIME (Patient taking differently: Take 100 mg by mouth.) 90 tablet 0   metoprolol tartrate (LOPRESSOR) 25 MG tablet Take 1 tablet (25 mg total) by mouth 2 (two) times daily. 180 tablet 0   buPROPion (WELLBUTRIN XL) 150 MG 24 hr tablet Take 150 mg by mouth daily.     ALPRAZolam (XANAX) 0.5 MG tablet Take  1 tablet (0.5 mg total) by mouth at bedtime as needed for anxiety. 2 tablet 0   loratadine (CLARITIN) 10 MG tablet Take 10 mg by mouth daily.     No facility-administered medications prior to visit.     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   SOCIAL HISTORY: Social History   Socioeconomic History   Marital status: Married    Spouse name: Not on file   Number of children: Not on file   Years of education: Not on file   Highest education level: Not on file  Occupational History   Not on file  Tobacco Use   Smoking status: Former    Years: 2.00    Types: Cigarettes   Smokeless tobacco: Never   Tobacco comments:    5 cig./week  Substance and Sexual Activity   Alcohol use: Yes    Comment: 2-3 x/week   Drug use: No   Sexual activity: Not on file  Other Topics Concern   Not on file  Social History Narrative   Not on file   Social Determinants of Health   Financial Resource Strain: Not on file  Food Insecurity: Not on file  Transportation Needs: Not on file  Physical Activity: Not on file  Stress: Not on file   Social Connections: Not on file  Intimate Partner Violence: Not on file     PHYSICAL EXAM  Vitals:   08/19/21 1241  BP: 127/88  Pulse: 92  Weight: 209 lb 8 oz (95 kg)  Height: 5\' 10"  (1.778 m)   Body mass index is 30.06 kg/m.  Generalized: Well developed, in no acute distress  Cardiology: normal rate and rhythm, no murmur auscultated  Respiratory: clear to auscultation bilaterally    Neurological examination  Mentation: Alert oriented to time, place, history taking. Follows all commands speech and language fluent Cranial nerve II-XII:  Pupils were equal round reactive to light. Extraocular movements were full, visual field were full on confrontational test. Facial sensation and strength were normal. Head turning and shoulder shrug  were normal and symmetric. Motor: The motor testing reveals 5 over 5 strength of all 4 extremities. Good symmetric motor tone is noted throughout.  Gait and station: Gait is normal.    DIAGNOSTIC DATA (LABS, IMAGING, TESTING) - I reviewed patient records, labs, notes, testing and imaging myself where available.  Lab Results  Component Value Date   WBC 6.9 10/19/2020   HGB 15.6 10/19/2020   HCT 44.1 10/19/2020   MCV 86 10/19/2020   PLT 351 10/19/2020      Component Value Date/Time   NA 137 04/08/2021 1513   K 4.4 04/08/2021 1513   CL 100 04/08/2021 1513   CO2 21 04/08/2021 1513   GLUCOSE 95 04/08/2021 1513   GLUCOSE 106 (H) 08/05/2013 0443   BUN 14 04/08/2021 1513   CREATININE 0.99 04/08/2021 1513   CALCIUM 9.3 04/08/2021 1513   PROT 7.8 04/08/2021 1513   ALBUMIN 4.9 04/08/2021 1513   AST 17 04/08/2021 1513   ALT 19 04/08/2021 1513   ALKPHOS 112 04/08/2021 1513   BILITOT 0.3 04/08/2021 1513   GFRNONAA 101 07/31/2020 1510   GFRAA 117 07/31/2020 1510   No results found for: CHOL, HDL, LDLCALC, LDLDIRECT, TRIG, CHOLHDL No results found for: YPPJ0D Lab Results  Component Value Date   VITAMINB12 490 10/19/2020   Lab Results   Component Value Date   TSH 1.130 10/19/2020    No flowsheet data found.   No flowsheet data found.   ASSESSMENT AND PLAN  31 y.o. year old male  has a past medical history of ADHD, COVID, Diarrhea, GAD (generalized anxiety disorder), GERD (gastroesophageal reflux disease), Healing laceration (11/07/2011), Insomnia, Jock itch, Tinea cruris, and Tympanic membrane perforation (10/26/2011). here with    Migraine with aura and without status migrainosus, not intractable  Olfactory aura  Insomnia due to medical condition  Louis Vang reports headaches have improved on metoprolol. Unfortunately, he is concerned about a drop in heart rate from time to time. No red flag symptoms or syncope. I will switch him to metoprolol XR 25mg  daily. I am hopeful that extended delivery of medication will limit pulse drop. We will try Nurtec daily as needed for abortive therapy. Administration and side effects reviewed. Triptans avoided in migraine with aura. PCP has increased trazodone to mg nightly and seems to be working well. He will continue as directed by PCP. Lunesta as needed per PCP. He will continue Wellbutrin and Vyvanse per Dr Lapp's recommendation. We have discussed increased side effects of metoprolol with Wellbutrin. He will monitor closely, however, I suspect he will do well with low dose metoprolol as it has been effective for headaches.    No orders of the defined types were placed in this encounter.    Meds ordered this encounter  Medications   metoprolol succinate (TOPROL XL) 25 MG 24 hr tablet    Sig: Take 1 tablet (25 mg total) by mouth daily.    Dispense:  90 tablet    Refill:  3    Order Specific Question:   Supervising Provider    Answer:   Anson Fret   Rimegepant Sulfate (NURTEC) 75 MG TBDP    Sig: Take 75 mg by mouth daily as needed (take for abortive therapy of migraine, no more than 1 tablet in 24 hours or 10 per month).  Dispense:  8 tablet    Refill:  11     Order Specific Question:   Supervising Provider    Answer:   Anson FretAHERN, ANTONIA B [[id number]]      Shawnie DapperAmy Davius Goudeau, MSN, FNP-C 08/19/2021, 1:57 PM  [facility] [address], [address] [place], [place] [address] [phone]

## 2021-08-19 NOTE — Telephone Encounter (Signed)
Submitted PA Nurtec on CMM. Key: [id number]. Waiting on determination from [facility].  Pt has not tried/failed triptan. May deny but copay card was provided to pt at visit today per NP.

## 2021-08-24 NOTE — Telephone Encounter (Signed)
PA denied. "Coverage is provided for patients who tried at least one triptan or have a contraindication to triptan(s)  according to the prescriber. Examples of contraindications to triptans include a history of coronary  artery disease; cardiac accessory conduction pathway disorders; history of stroke, transient ischemic  attack, or hemiplegic or basilar migraine; peripheral vascular disease; ischemic bowel disease;  uncontrolled hypertension; or severe hepatic impairment. Coverage cannot be authorized at this time."

## 2021-08-27 ENCOUNTER — Encounter (HOSPITAL_COMMUNITY): Payer: Self-pay

## 2021-08-27 ENCOUNTER — Ambulatory Visit (INDEPENDENT_AMBULATORY_CARE_PROVIDER_SITE_OTHER): Payer: PRIVATE HEALTH INSURANCE | Admitting: Licensed Clinical Social Worker

## 2021-08-27 ENCOUNTER — Other Ambulatory Visit: Payer: Self-pay

## 2021-08-27 DIAGNOSIS — F411 Generalized anxiety disorder: Secondary | ICD-10-CM

## 2021-08-27 DIAGNOSIS — F4321 Adjustment disorder with depressed mood: Secondary | ICD-10-CM | POA: Diagnosis not present

## 2021-08-27 NOTE — Plan of Care (Signed)
?  Problem: Anxiety Disorder CCP Problem  1 Reduce anxiety ?Goal:  [name] will manage mood and anxiety as evidenced by coping with daily stressors, coping with and managing chronic health issues, and coping with physical health set backs/increased fatigue for 5 out of 7 days for 60 days.  ?Outcome: Not Progressing ?Goal: STG: Patient will practice problem solving skills 3 times per week for the next 4 weeks ?Outcome: Not Progressing ?  ?
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
## 2021-09-17 ENCOUNTER — Ambulatory Visit (INDEPENDENT_AMBULATORY_CARE_PROVIDER_SITE_OTHER): Payer: PRIVATE HEALTH INSURANCE | Admitting: Licensed Clinical Social Worker

## 2021-09-17 DIAGNOSIS — F411 Generalized anxiety disorder: Secondary | ICD-10-CM

## 2021-09-18 NOTE — Progress Notes (Signed)
Virtual Visit via Video Note ? ?I connected with Louis Vang on 09/18/21 at 11:00 AM EDT by a video enabled telemedicine application and verified that I am speaking with the correct person using two identifiers. ? ?Location: ?Patient: Home ?Provider: Office ?  ?I discussed the limitations of evaluation and management by telemedicine and the availability of in person appointments. The patient expressed understanding and agreed to proceed. ? ? ?THERAPIST PROGRESS NOTE ? ?Session Time: 11:00 am-11:45 am ? ?Type of Therapy: Individual Therapy ? ?Treatment Goals addressed: Louis Vang will manage mood and anxiety as evidenced by coping with daily stressors, coping with and managing chronic health issues, and coping with physical health set backs/increased fatigue for 5 out of 7 days for 60 days. ? ?ProgressTowards Goals: Initial ? ?Interventions: Therapist utilized CBT and Solution focused brief therapy to patient during session. Therapist provided support and empathy to patient during session. Therapist explored patient's triggers for anxiety. Therapist worked with patient to stay present focused and work on his physical health.  ? ?Effectiveness: Patient was oriented x4 (person, place, situation, and time). Patient was alert, engaged, pleasant, and cooperative. Patient was casually dressed, and appropriately groomed. Patient noted that his mood and anxiety have been stable. He does worry about having children in the future. He feels like his chronic fatigue could impact this. It can take 4-7 years for him to recover and he feels like that may be too late for children because he will be in his mid to late 30's. Patient doesn't want to give a child a bad childhood due to his inability to play, lift, etc due to fatigue. Patient understood to stay present focused and take care of his physical health to heal. ? ?Patient engaged in session. Patient responded well to interventions. Patient continues to meet criteria for  Generalized Anxiety Disorder. Patient will continue in outpatient therapy due to being the least restrictive service to meet his needs.  ? ?Suicidal/Homicidal: Nowithout intent/plan ? ?Plan: Return again in 2-4 weeks. ? ?Diagnosis: Generalized anxiety disorder ? ?Collaboration of Care: Other resources will be identified to collaborate with.  ? ?Patient/Guardian was advised Release of Information must be obtained prior to any record release in order to collaborate their care with an outside provider. Patient/Guardian was advised if they have not already done so to contact the registration department to sign all necessary forms in order for [place] to release information regarding their care.  ? ?Consent: Patient/Guardian gives verbal consent for treatment and assignment of benefits for services provided during this visit. Patient/Guardian expressed understanding and agreed to proceed.  ? ?I discussed the assessment and treatment plan with the patient. The patient was provided an opportunity to ask questions and all were answered. The patient agreed with the plan and demonstrated an understanding of the instructions. ?  ?The patient was advised to call back or seek an in-person evaluation if the symptoms worsen or if the condition fails to improve as anticipated. ? ?I provided 45 minutes of non-face-to-face time during this encounter. ? ?[name], LCSW ?09/18/2021 ? ?

## 2021-10-01 ENCOUNTER — Ambulatory Visit (INDEPENDENT_AMBULATORY_CARE_PROVIDER_SITE_OTHER): Payer: PRIVATE HEALTH INSURANCE | Admitting: Licensed Clinical Social Worker

## 2021-10-01 DIAGNOSIS — F411 Generalized anxiety disorder: Secondary | ICD-10-CM | POA: Diagnosis not present

## 2021-10-01 NOTE — Progress Notes (Signed)
Virtual Visit via Video Note ? ?I connected with Louis Vang on 10/01/21 at 10:00 AM EDT by a video enabled telemedicine application and verified that I am speaking with the correct person using two identifiers. ? ?Location: ?Patient: Home ?Provider: Office ?  ?I discussed the limitations of evaluation and management by telemedicine and the availability of in person appointments. The patient expressed understanding and agreed to proceed. ? ? ?THERAPIST PROGRESS NOTE ? ?Session Time: 10:00 am-10:45 am ? ?Type of Therapy: Individual Therapy ? ?Treatment Goals addressed: Louis Vang will manage mood and anxiety as evidenced by coping with daily stressors, coping with and managing chronic health issues, and coping with physical health set backs/increased fatigue for 5 out of 7 days for 60 days. ? ?ProgressTowards Goals: Progressing ? ?Interventions: Therapist utilized CBT and Solution focused brief therapy to patient during session. Therapist provided support and empathy to patient during session. Therapist explored patient's sleep. Therapist processed patient's feelings and focus on gratitude.  ? ?Effectiveness: Patient was oriented x4 (person, place, situation, and time). Patient was casually dressed, and appropriately groomed. Patient was alert, engaged, pleasant, and cooperative. Patient has felt ok. His sleep has been ok but he does have times he lies in bed for hours before sleeping. Patient understood that if he can't sleep he can get out of bed until he gets tired or stay in bed and focus on rest. Patient has also had depressive moments reflecting on the year and seeing his friends go on trips, etc. 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 Patient understood he needs to focus on his growth, where he has been physically and where he is at now as well as being grateful for what he does have as well as what he can do.  ? ?Patient  engaged in session. Patient responded well to interventions. Patient continues to meet criteria for Generalized Anxiety Disorder. Patient will continue in outpatient therapy due to being the least restrictive service to meet his needs. Patient made minimal progress on his goals.  ? ?Suicidal/Homicidal: Nowithout intent/plan ? ?Plan: Return again in 2-4 weeks. Patient will focus on gratitude, and identifying where he has come from to where he is at now.  ? ?Diagnosis: Generalized anxiety disorder ? ?Collaboration of Care: Other resources will be identified to collaborate with.  ? ?Patient/Guardian was advised Release of Information must be obtained prior to any record release in order to collaborate their care with an outside provider. Patient/Guardian was advised if they have not already done so to contact the registration department to sign all necessary forms in order for [place] to release information regarding their care.  ? ?Consent: Patient/Guardian gives verbal consent for treatment and assignment of benefits for services provided during this visit. Patient/Guardian expressed understanding and agreed to proceed.  ? ?I discussed the assessment and treatment plan with the patient. The patient was provided an opportunity to ask questions and all were answered. The patient agreed with the plan and demonstrated an understanding of the instructions. ?  ?The patient was advised to call back or seek an in-person evaluation if the symptoms worsen or if the condition fails to improve as anticipated. ? ?I provided 45 minutes of non-face-to-face time during this encounter. ? ?Glori Bickers, LCSW ?10/01/2021 ? ?

## 2021-10-03 ENCOUNTER — Other Ambulatory Visit: Payer: Self-pay | Admitting: Neurology

## 2021-10-15 ENCOUNTER — Ambulatory Visit (INDEPENDENT_AMBULATORY_CARE_PROVIDER_SITE_OTHER): Payer: PRIVATE HEALTH INSURANCE | Admitting: Licensed Clinical Social Worker

## 2021-10-15 DIAGNOSIS — F411 Generalized anxiety disorder: Secondary | ICD-10-CM | POA: Diagnosis not present

## 2021-10-15 DIAGNOSIS — F4321 Adjustment disorder with depressed mood: Secondary | ICD-10-CM

## 2021-10-16 NOTE — Progress Notes (Signed)
Virtual Visit via Video Note ? ?I connected with Louis Vang on 10/16/21 at 10:00 AM EDT by a video enabled telemedicine application and verified that I am speaking with the correct person using two identifiers. ? ?Location: ?Patient: Home ?Provider: Office ?  ?I discussed the limitations of evaluation and management by telemedicine and the availability of in person appointments. The patient expressed understanding and agreed to proceed. ? ? ?THERAPIST PROGRESS NOTE ? ?Session Time: 10:00 am-10:45 am ? ?Type of Therapy: Individual Therapy ? ?Treatment Goals addressed: [name] will manage mood and anxiety as evidenced by coping with daily stressors, coping with and managing chronic health issues, and coping with physical health set backs/increased fatigue for 5 out of 7 days for 60 days. ? ?ProgressTowards Goals: Progressing ? ?Interventions: Therapist utilized CBT and Solution focused brief therapy to patient during session. Therapist provided support and empathy to patient during session. Therapist explored patient's feeling of burnout and steps he is taking to improve.  ? ?Effectiveness: Patient was oriented x4 (person, place, situation, and time). Patient was casually dressed, and appropriately groomed. Patient was alert, engaged, pleasant, and cooperative. Patient has been feeling burned out at work. He is doing just enough to get by. Patient is looking for other jobs, and has an interview after session. Patient is not passionate about his work anymore partially because the company he works for is not growing and has been shrinking. Patient wants a job where is passionate about the work, and is using the tech he enjoys. Patient is being mindful of his physical activity and not allowing himself to go over a certain amount of steps.   ? ?Patient engaged in session. Patient responded well to interventions. Patient continues to meet criteria for Generalized Anxiety Disorder. Patient will continue in outpatient  therapy due to being the least restrictive service to meet his needs. Patient made minimal progress on his goals.  ? ?Suicidal/Homicidal: Nowithout intent/plan ? ?Plan: Return again in 2-4 weeks. Patient will focus on not overdoing it physically, and doing what he can to change his feeling of burnout.  ? ?Diagnosis: Generalized anxiety disorder ? ?Adjustment disorder with depressed mood ? ?Collaboration of Care: Other resources will be identified to collaborate with.  ? ?Patient/Guardian was advised Release of Information must be obtained prior to any record release in order to collaborate their care with an outside provider. Patient/Guardian was advised if they have not already done so to contact the registration department to sign all necessary forms in order for [place] to release information regarding their care.  ? ?Consent: Patient/Guardian gives verbal consent for treatment and assignment of benefits for services provided during this visit. Patient/Guardian expressed understanding and agreed to proceed.  ? ?I discussed the assessment and treatment plan with the patient. The patient was provided an opportunity to ask questions and all were answered. The patient agreed with the plan and demonstrated an understanding of the instructions. ?  ?The patient was advised to call back or seek an in-person evaluation if the symptoms worsen or if the condition fails to improve as anticipated. ? ?I provided 45 minutes of non-face-to-face time during this encounter. ? ?[name], LCSW ?10/16/2021 ? ?

## 2021-10-29 ENCOUNTER — Ambulatory Visit (INDEPENDENT_AMBULATORY_CARE_PROVIDER_SITE_OTHER): Payer: PRIVATE HEALTH INSURANCE | Admitting: Licensed Clinical Social Worker

## 2021-10-29 DIAGNOSIS — F411 Generalized anxiety disorder: Secondary | ICD-10-CM | POA: Diagnosis not present

## 2021-10-31 NOTE — Progress Notes (Signed)
Virtual Visit via Video Note ? ?I connected with Louis Vang on 10/31/21 at 10:00 AM EDT by a video enabled telemedicine application and verified that I am speaking with the correct person using two identifiers. ? ?Location: ?Patient: Home ?Provider: Office ?  ?I discussed the limitations of evaluation and management by telemedicine and the availability of in person appointments. The patient expressed understanding and agreed to proceed. ? ? ?THERAPIST PROGRESS NOTE ? ?Session Time: 10:00 am-10:45 am ? ?Type of Therapy: Individual Therapy ? ?Treatment Goals addressed: Louis Vang will manage mood and anxiety as evidenced by coping with daily stressors, coping with and managing chronic health issues, and coping with physical health set backs/increased fatigue for 5 out of 7 days for 60 days. ? ?ProgressTowards Goals: Progressing ? ?Interventions: Therapist utilized CBT and Solution focused brief therapy to patient during session. Therapist provided support and empathy to patient during session. Therapist explored patients mental and physical health. Therapist worked with patient to identify what he needs to focus on to improve mood.  ? ?Effectiveness:  Patient was oriented x4 (person, place, situation, and time). Patient was casually dressed, and appropriately groomed. Patient was alert, engaged, pleasant, and cooperative. Patient has been doing good mood and anxiety wise. He continues to experience disrupted mood and anxiety at times. Patient had a short relapse with his Chronic Fatigue syndrome but regained his energy after a few days. Patient was hopeful after this. His setbacks usually last weeks or months not days. Patient decided to stay at his current job rather than move to a different company. He feels like it will help him learn more and be more well rounded. Patient is going to work on meditation going forward. He has gotten out of the habit of doing things that work for him. ? ?Patient engaged in session.  Patient responded well to interventions. Patient continues to meet criteria for Generalized Anxiety Disorder. Patient will continue in outpatient therapy due to being the least restrictive service to meet his needs. Patient made moderate progress on his goals.  ? ?Suicidal/Homicidal: Nowithout intent/plan ? ?Plan: Return again in 2-4 weeks.  ? ?Diagnosis: Generalized anxiety disorder ? ?Collaboration of Care: Other resources will be identified to collaborate with.  ? ?Patient/Guardian was advised Release of Information must be obtained prior to any record release in order to collaborate their care with an outside provider. Patient/Guardian was advised if they have not already done so to contact the registration department to sign all necessary forms in order for [place] to release information regarding their care.  ? ?Consent: Patient/Guardian gives verbal consent for treatment and assignment of benefits for services provided during this visit. Patient/Guardian expressed understanding and agreed to proceed.  ? ?I discussed the assessment and treatment plan with the patient. The patient was provided an opportunity to ask questions and all were answered. The patient agreed with the plan and demonstrated an understanding of the instructions. ?  ?The patient was advised to call back or seek an in-person evaluation if the symptoms worsen or if the condition fails to improve as anticipated. ? ?I provided 45 minutes of non-face-to-face time during this encounter. ? ?Glori Bickers, LCSW ?10/31/2021 ? ?

## 2021-11-12 ENCOUNTER — Ambulatory Visit (INDEPENDENT_AMBULATORY_CARE_PROVIDER_SITE_OTHER): Payer: PRIVATE HEALTH INSURANCE | Admitting: Licensed Clinical Social Worker

## 2021-11-12 DIAGNOSIS — F4321 Adjustment disorder with depressed mood: Secondary | ICD-10-CM

## 2021-11-12 DIAGNOSIS — F411 Generalized anxiety disorder: Secondary | ICD-10-CM | POA: Diagnosis not present

## 2021-11-13 NOTE — Progress Notes (Signed)
Virtual Visit via Video Note  I connected with Louis Vang on 11/13/21 at  9:00 AM EDT by a video enabled telemedicine application and verified that I am speaking with the correct person using two identifiers.  Location: Patient: Home Provider: Office   I discussed the limitations of evaluation and management by telemedicine and the availability of in person appointments. The patient expressed understanding and agreed to proceed.   THERAPIST PROGRESS NOTE  Session Time: 9:00 am-9:45 am  Type of Therapy: Individual Therapy  Treatment Goals addressed: Louis Vang will manage mood and anxiety as evidenced by coping with daily stressors, coping with and managing chronic health issues, and coping with physical health set backs/increased fatigue for 5 out of 7 days for 60 days.  ProgressTowards Goals: Progressing  Interventions: Therapist utilized CBT and Solution focused brief therapy to patient during session. Therapist provided support and empathy to patient during session. Therapist explored patient's triggers for mood and anxiety. Therapist provided psychoeducation on the chronic pain cycle and connected it to chronic fatigue syndrome.   Effectiveness:  Patient was oriented x4 (person, place, situation, and time). Patient was casually dressed, and appropriately groomed. Patient was alert, engaged, pleasant, and cooperative. Patient has decided to change jobs. 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 Patient was stressed during the interview but didn't crash after. Patient noted that his anxiety has been a little higher but it has been managable. Patient understood the chronic pain cycle including pain, resting, negative thoughts/feelings, stress, and again pain. While patient doesn't have chronic pain, he has a chronic health issue. Patient is going to focus on meditation to manage anxiety.   Patient engaged in session. Patient responded well to interventions.  Patient continues to meet criteria for Generalized Anxiety Disorder. Patient will continue in outpatient therapy due to being the least restrictive service to meet his needs. Patient made moderate progress on his goals.   Suicidal/Homicidal: Nowithout intent/plan  Plan: Return again in 2-4 weeks.   Diagnosis: Generalized anxiety disorder  Adjustment disorder with depressed mood  Collaboration of Care: Other resources will be identified to collaborate with.   Patient/Guardian was advised Release of Information must be obtained prior to any record release in order to collaborate their care with an outside provider. Patient/Guardian was advised if they have not already done so to contact the registration department to sign all necessary forms in order for [place] to release information regarding their care.   Consent: Patient/Guardian gives verbal consent for treatment and assignment of benefits for services provided during this visit. Patient/Guardian expressed understanding and agreed to proceed.   I discussed the assessment and treatment plan with the patient. The patient was provided an opportunity to ask questions and all were answered. The patient agreed with the plan and demonstrated an understanding of the instructions.   The patient was advised to call back or seek an in-person evaluation if the symptoms worsen or if the condition fails to improve as anticipated.  I provided 45 minutes of non-face-to-face time during this encounter.  [name], LCSW 11/13/2021

## 2021-12-03 ENCOUNTER — Ambulatory Visit (INDEPENDENT_AMBULATORY_CARE_PROVIDER_SITE_OTHER): Payer: PRIVATE HEALTH INSURANCE | Admitting: Licensed Clinical Social Worker

## 2021-12-03 DIAGNOSIS — F4321 Adjustment disorder with depressed mood: Secondary | ICD-10-CM

## 2021-12-04 NOTE — Progress Notes (Signed)
Virtual Visit via Video Note  I connected with Louis Vang on 12/04/21 at  9:00 AM EDT by a video enabled telemedicine application and verified that I am speaking with the correct person using two identifiers.  Location: Patient: Home Provider: Office   I discussed the limitations of evaluation and management by telemedicine and the availability of in person appointments. The patient expressed understanding and agreed to proceed.   THERAPIST PROGRESS NOTE  Session Time: 9:00 am-9:45 am  Type of Therapy: Individual Therapy  Treatment Goals addressed: [name] will manage mood and anxiety as evidenced by coping with daily stressors, coping with and managing chronic health issues, and coping with physical health set backs/increased fatigue for 5 out of 7 days for 60 days.  ProgressTowards Goals: Progressing  Interventions: Therapist utilized CBT and Solution focused brief therapy to patient during session. Therapist provided support and empathy to patient during session. Therapist explored patient's physical health and anxiety. Therapist processed patient's feelings related to depression and steps to manage it.   Effectiveness:  Patient was oriented x4 (person, place, situation, and time). Patient was casually dressed, and appropriately groomed. Patient was alert, engaged, pleasant, and cooperative. Patient noted that he stayed at his job rather than going to a new one. He got a raise. Patient has been doing well physically, and his anxiety has been managed. Patient has had depressive moments (about 2 hours) after the work day. Patient just sits with the depressive feelings and they pass. Patient has not been using meditation, or journaling. Patient is planning on using them again.   Patient engaged in session. Patient responded well to interventions. Patient continues to meet criteria for Generalized Anxiety Disorder. Patient will continue in outpatient therapy due to being the least  restrictive service to meet his needs. Patient made moderate progress on his goals.   Suicidal/Homicidal: Nowithout intent/plan  Plan: Return again in 2-4 weeks. Patient will journal, and meditate.  Diagnosis: Adjustment disorder with depressed mood  Collaboration of Care: Other resources will be identified to collaborate with.   Patient/Guardian was advised Release of Information must be obtained prior to any record release in order to collaborate their care with an outside provider. Patient/Guardian was advised if they have not already done so to contact the registration department to sign all necessary forms in order for [place] to release information regarding their care.   Consent: Patient/Guardian gives verbal consent for treatment and assignment of benefits for services provided during this visit. Patient/Guardian expressed understanding and agreed to proceed.   I discussed the assessment and treatment plan with the patient. The patient was provided an opportunity to ask questions and all were answered. The patient agreed with the plan and demonstrated an understanding of the instructions.   The patient was advised to call back or seek an in-person evaluation if the symptoms worsen or if the condition fails to improve as anticipated.  I provided 45 minutes of non-face-to-face time during this encounter.  [name], LCSW 12/04/2021

## 2021-12-17 ENCOUNTER — Ambulatory Visit (HOSPITAL_COMMUNITY): Payer: PRIVATE HEALTH INSURANCE | Admitting: Licensed Clinical Social Worker

## 2022-02-17 ENCOUNTER — Ambulatory Visit: Payer: PRIVATE HEALTH INSURANCE | Admitting: Family Medicine

## 2022-06-29 ENCOUNTER — Ambulatory Visit: Payer: PRIVATE HEALTH INSURANCE | Admitting: Family Medicine

## 2022-07-11 ENCOUNTER — Other Ambulatory Visit: Payer: Self-pay

## 2022-07-11 MED ORDER — METOPROLOL SUCCINATE ER 25 MG PO TB24: 25.0000 mg | ORAL_TABLET | Freq: Every day | ORAL | 0 refills | Status: DC

## 2022-09-02 ENCOUNTER — Other Ambulatory Visit (HOSPITAL_BASED_OUTPATIENT_CLINIC_OR_DEPARTMENT_OTHER): Payer: Self-pay

## 2022-09-02 MED ORDER — LISDEXAMFETAMINE DIMESYLATE 10 MG PO CAPS
10.0000 mg | ORAL_CAPSULE | Freq: Every day | ORAL | 0 refills | Status: AC
  Filled 2022-09-02: qty 30, 30d supply, fill #0

## 2022-09-06 IMAGING — CR DG ORBITS FOR FOREIGN BODY
2 series · 2 of 2 positions shown · non-contrast
Comparison: None.

CLINICAL DATA: Metal working/exposure; clearance prior to MRI

EXAM:
ORBITS FOR FOREIGN BODY - 2 VIEW

[w orbit pa (1 of 2)]
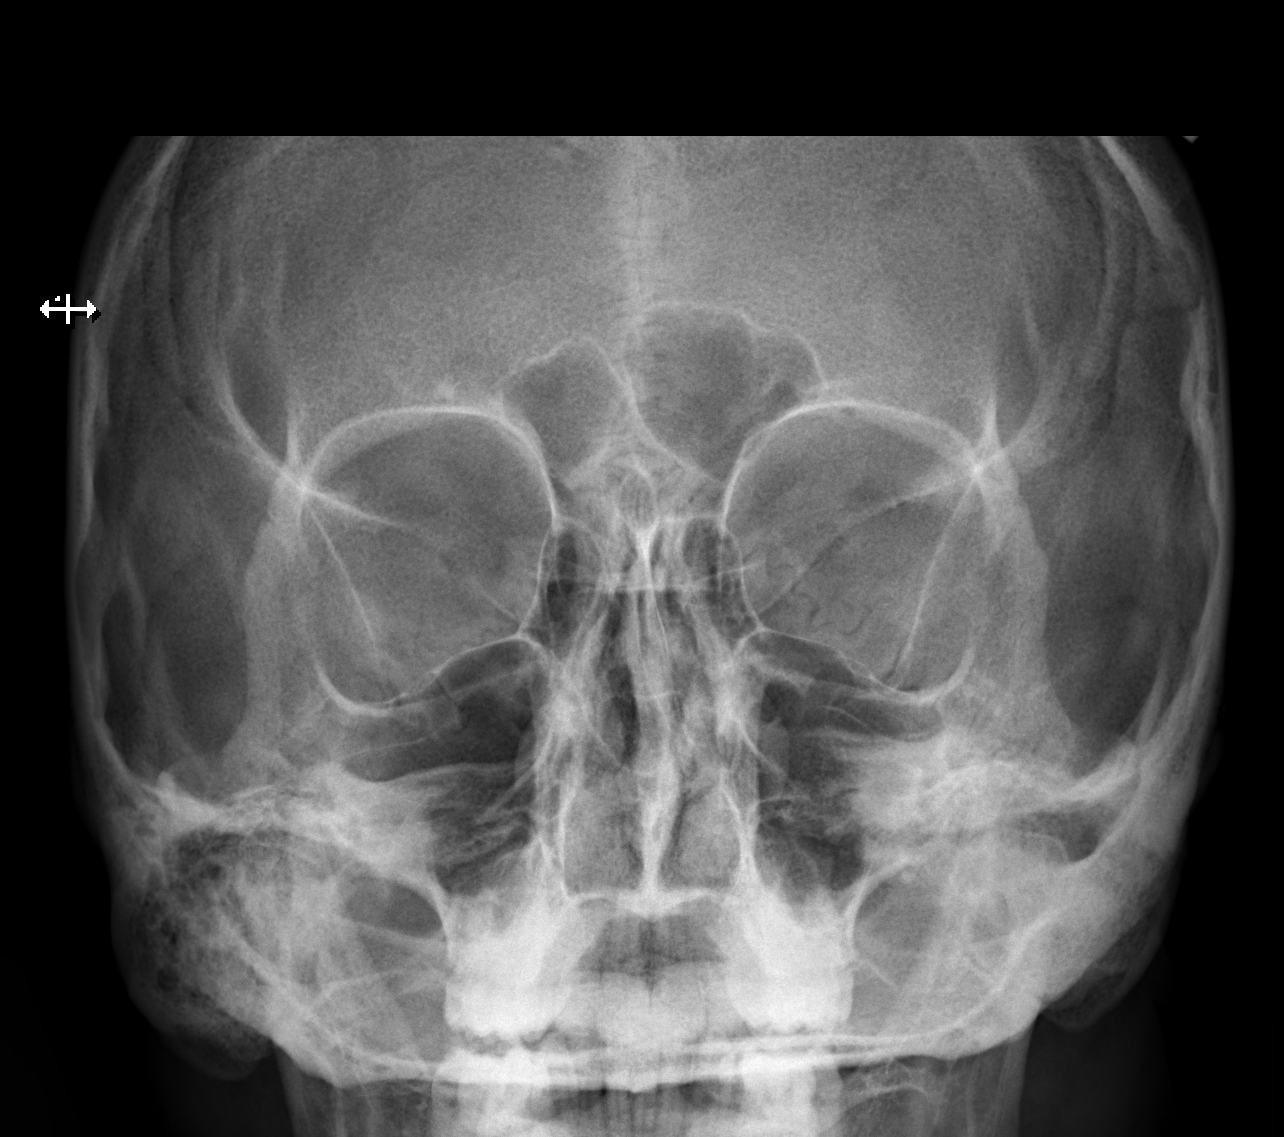

[w orbit pa (2 of 2)]
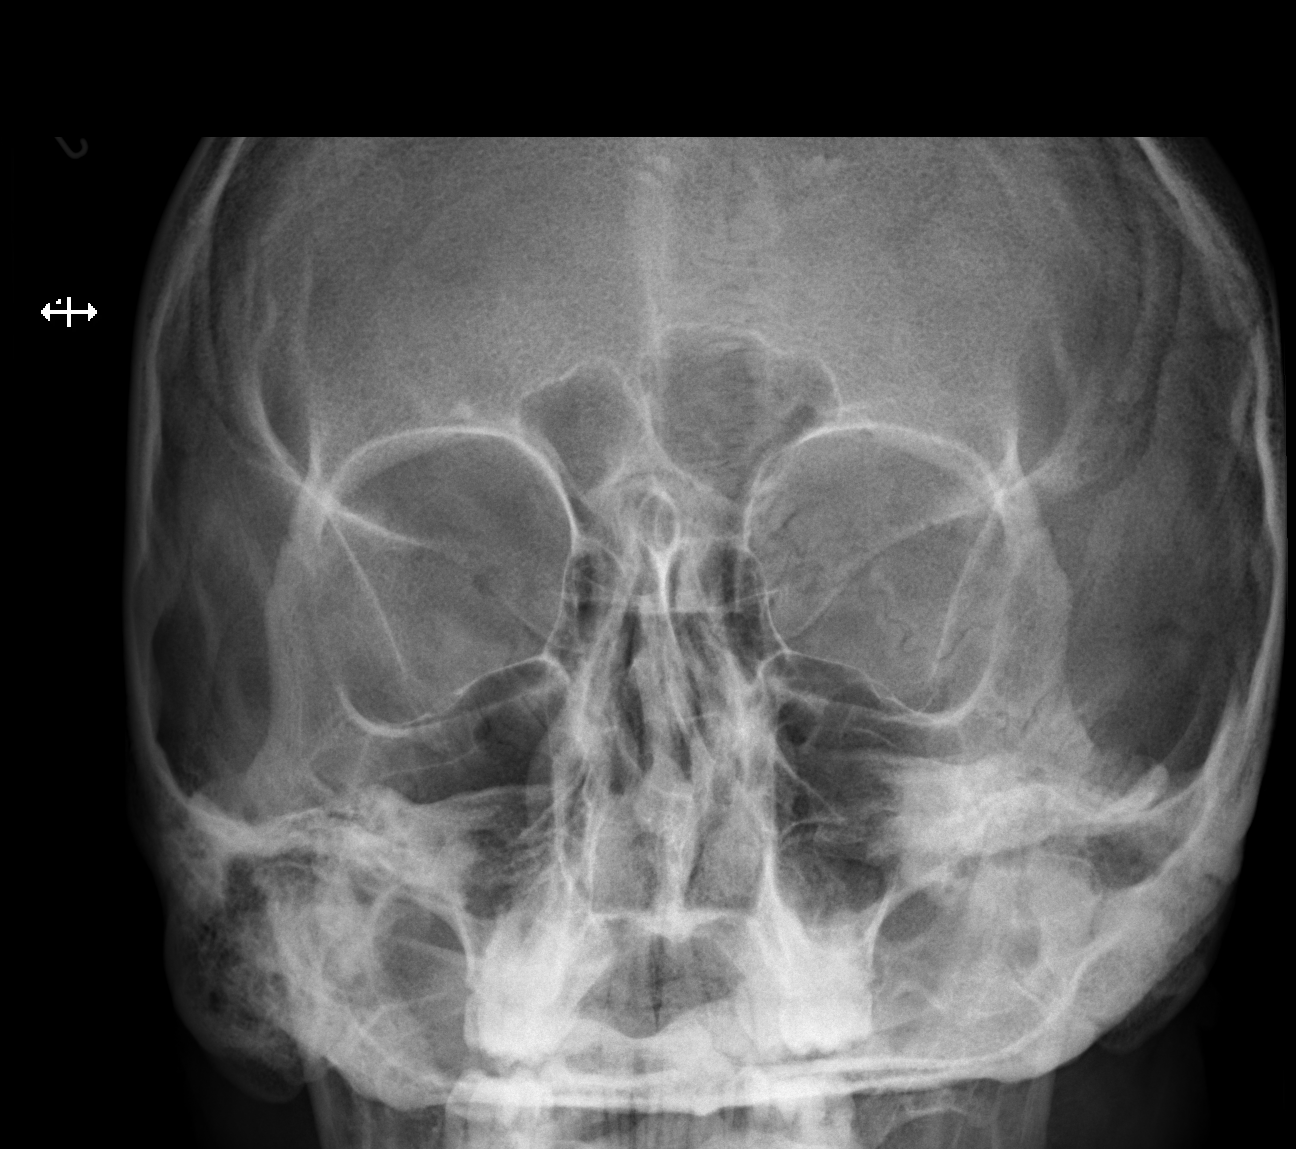

[2 of 2 positions shown; findings below may reference images not displayed]

FINDINGS: There is no evidence of metallic foreign body within the orbits. No
significant bone abnormality identified.
IMPRESSION: No evidence of metallic foreign body within the orbits.

## 2022-09-19 ENCOUNTER — Other Ambulatory Visit: Payer: Self-pay | Admitting: Family Medicine

## 2022-09-20 NOTE — Telephone Encounter (Signed)
Last seen on 08/19/21 No follow up visit scheduled

## 2022-09-21 ENCOUNTER — Other Ambulatory Visit: Payer: Self-pay | Admitting: Family Medicine

## 2022-09-21 ENCOUNTER — Other Ambulatory Visit (HOSPITAL_BASED_OUTPATIENT_CLINIC_OR_DEPARTMENT_OTHER): Payer: Self-pay

## 2022-09-21 MED ORDER — METOPROLOL SUCCINATE ER 25 MG PO TB24
25.0000 mg | ORAL_TABLET | Freq: Every day | ORAL | 0 refills | Status: DC
  Filled 2022-09-21: qty 90, 90d supply, fill #0

## 2022-09-21 MED ORDER — FAMOTIDINE 20 MG PO TABS
ORAL_TABLET | ORAL | 3 refills | Status: AC
  Filled 2022-09-21: qty 60, 30d supply, fill #0

## 2022-09-26 ENCOUNTER — Other Ambulatory Visit (HOSPITAL_BASED_OUTPATIENT_CLINIC_OR_DEPARTMENT_OTHER): Payer: Self-pay

## 2022-09-28 ENCOUNTER — Other Ambulatory Visit (HOSPITAL_BASED_OUTPATIENT_CLINIC_OR_DEPARTMENT_OTHER): Payer: Self-pay

## 2022-09-29 ENCOUNTER — Other Ambulatory Visit (HOSPITAL_BASED_OUTPATIENT_CLINIC_OR_DEPARTMENT_OTHER): Payer: Self-pay

## 2022-09-29 MED ORDER — LISDEXAMFETAMINE DIMESYLATE 20 MG PO CAPS
20.0000 mg | ORAL_CAPSULE | Freq: Every morning | ORAL | 0 refills | Status: DC
  Filled 2022-09-29: qty 30, 30d supply, fill #0

## 2022-09-30 ENCOUNTER — Other Ambulatory Visit (HOSPITAL_BASED_OUTPATIENT_CLINIC_OR_DEPARTMENT_OTHER): Payer: Self-pay

## 2022-10-18 ENCOUNTER — Other Ambulatory Visit (HOSPITAL_BASED_OUTPATIENT_CLINIC_OR_DEPARTMENT_OTHER): Payer: Self-pay

## 2022-10-18 MED ORDER — TRAZODONE HCL 150 MG PO TABS
150.0000 mg | ORAL_TABLET | Freq: Every day | ORAL | 1 refills | Status: AC
  Filled 2022-10-18: qty 90, 90d supply, fill #0

## 2022-11-01 ENCOUNTER — Other Ambulatory Visit (HOSPITAL_BASED_OUTPATIENT_CLINIC_OR_DEPARTMENT_OTHER): Payer: Self-pay

## 2022-11-01 MED ORDER — LISDEXAMFETAMINE DIMESYLATE 20 MG PO CAPS
20.0000 mg | ORAL_CAPSULE | Freq: Every morning | ORAL | 0 refills | Status: DC
  Filled 2022-11-01: qty 30, 30d supply, fill #0

## 2022-11-28 ENCOUNTER — Other Ambulatory Visit (HOSPITAL_BASED_OUTPATIENT_CLINIC_OR_DEPARTMENT_OTHER): Payer: Self-pay

## 2022-12-05 ENCOUNTER — Other Ambulatory Visit (HOSPITAL_BASED_OUTPATIENT_CLINIC_OR_DEPARTMENT_OTHER): Payer: Self-pay

## 2022-12-05 MED ORDER — LISDEXAMFETAMINE DIMESYLATE 20 MG PO CAPS
20.0000 mg | ORAL_CAPSULE | Freq: Every morning | ORAL | 0 refills | Status: DC
  Filled 2022-12-05: qty 30, 30d supply, fill #0

## 2022-12-13 ENCOUNTER — Other Ambulatory Visit (HOSPITAL_BASED_OUTPATIENT_CLINIC_OR_DEPARTMENT_OTHER): Payer: Self-pay

## 2022-12-13 MED ORDER — ESCITALOPRAM OXALATE 10 MG PO TABS
10.0000 mg | ORAL_TABLET | Freq: Every day | ORAL | 1 refills | Status: AC
  Filled 2022-12-13: qty 90, 90d supply, fill #0
  Filled 2023-03-16: qty 90, 90d supply, fill #1

## 2022-12-13 MED ORDER — FAMOTIDINE 20 MG PO TABS
20.0000 mg | ORAL_TABLET | Freq: Two times a day (BID) | ORAL | 3 refills | Status: AC
  Filled 2022-12-13: qty 60, 30d supply, fill #0

## 2022-12-13 MED ORDER — METOPROLOL SUCCINATE ER 25 MG PO TB24
25.0000 mg | ORAL_TABLET | Freq: Every day | ORAL | 2 refills | Status: AC
  Filled 2022-12-13: qty 30, 30d supply, fill #0

## 2022-12-15 ENCOUNTER — Other Ambulatory Visit (HOSPITAL_BASED_OUTPATIENT_CLINIC_OR_DEPARTMENT_OTHER): Payer: Self-pay

## 2023-01-07 ENCOUNTER — Other Ambulatory Visit (HOSPITAL_BASED_OUTPATIENT_CLINIC_OR_DEPARTMENT_OTHER): Payer: Self-pay

## 2023-01-07 MED ORDER — LISDEXAMFETAMINE DIMESYLATE 20 MG PO CAPS
20.0000 mg | ORAL_CAPSULE | Freq: Every morning | ORAL | 0 refills | Status: DC
  Filled 2023-01-07: qty 30, 30d supply, fill #0

## 2023-01-18 ENCOUNTER — Other Ambulatory Visit (HOSPITAL_BASED_OUTPATIENT_CLINIC_OR_DEPARTMENT_OTHER): Payer: Self-pay

## 2023-01-18 MED ORDER — METOPROLOL SUCCINATE ER 25 MG PO TB24
25.0000 mg | ORAL_TABLET | Freq: Every day | ORAL | 1 refills | Status: AC
  Filled 2023-01-18: qty 90, 90d supply, fill #0
  Filled 2023-05-18 – 2023-05-30 (×2): qty 90, 90d supply, fill #1

## 2023-01-18 MED ORDER — METOPROLOL SUCCINATE ER 25 MG PO TB24
25.0000 mg | ORAL_TABLET | Freq: Every day | ORAL | 1 refills | Status: AC
  Filled 2023-01-18: qty 90, 90d supply, fill #0

## 2023-01-18 MED ORDER — TRAZODONE HCL 150 MG PO TABS
150.0000 mg | ORAL_TABLET | Freq: Every day | ORAL | 1 refills | Status: AC
  Filled 2023-01-18: qty 90, 90d supply, fill #0

## 2023-01-18 MED ORDER — TRAZODONE HCL 150 MG PO TABS
150.0000 mg | ORAL_TABLET | Freq: Every day | ORAL | 1 refills | Status: AC
  Filled 2023-01-18: qty 90, 90d supply, fill #0
  Filled 2023-04-10: qty 90, 90d supply, fill #1

## 2023-02-03 ENCOUNTER — Other Ambulatory Visit (HOSPITAL_BASED_OUTPATIENT_CLINIC_OR_DEPARTMENT_OTHER): Payer: Self-pay

## 2023-02-03 MED ORDER — LISDEXAMFETAMINE DIMESYLATE 20 MG PO CAPS
20.0000 mg | ORAL_CAPSULE | Freq: Every morning | ORAL | 0 refills | Status: AC
  Filled 2023-02-03 (×2): qty 30, 30d supply, fill #0

## 2023-02-13 ENCOUNTER — Other Ambulatory Visit (HOSPITAL_BASED_OUTPATIENT_CLINIC_OR_DEPARTMENT_OTHER): Payer: Self-pay

## 2023-02-13 MED ORDER — LISDEXAMFETAMINE DIMESYLATE 30 MG PO CAPS
30.0000 mg | ORAL_CAPSULE | Freq: Every morning | ORAL | 0 refills | Status: DC
  Filled 2023-02-13 (×2): qty 30, 30d supply, fill #0

## 2023-02-13 MED ORDER — OMEPRAZOLE 20 MG PO CPDR
20.0000 mg | DELAYED_RELEASE_CAPSULE | Freq: Every day | ORAL | 3 refills | Status: AC
  Filled 2023-02-13: qty 90, 90d supply, fill #0
  Filled 2023-05-26 – 2023-05-30 (×2): qty 90, 90d supply, fill #1

## 2023-03-16 ENCOUNTER — Other Ambulatory Visit (HOSPITAL_BASED_OUTPATIENT_CLINIC_OR_DEPARTMENT_OTHER): Payer: Self-pay

## 2023-03-16 MED ORDER — LISDEXAMFETAMINE DIMESYLATE 30 MG PO CAPS
30.0000 mg | ORAL_CAPSULE | Freq: Every morning | ORAL | 0 refills | Status: DC
  Filled 2023-03-16: qty 30, 30d supply, fill #0

## 2023-04-19 ENCOUNTER — Other Ambulatory Visit (HOSPITAL_BASED_OUTPATIENT_CLINIC_OR_DEPARTMENT_OTHER): Payer: Self-pay

## 2023-04-19 MED ORDER — LISDEXAMFETAMINE DIMESYLATE 30 MG PO CAPS
30.0000 mg | ORAL_CAPSULE | ORAL | 0 refills | Status: DC
  Filled 2023-04-19: qty 30, 30d supply, fill #0

## 2023-05-29 ENCOUNTER — Other Ambulatory Visit (HOSPITAL_BASED_OUTPATIENT_CLINIC_OR_DEPARTMENT_OTHER): Payer: Self-pay

## 2023-05-30 ENCOUNTER — Other Ambulatory Visit (HOSPITAL_BASED_OUTPATIENT_CLINIC_OR_DEPARTMENT_OTHER): Payer: Self-pay

## 2023-05-31 ENCOUNTER — Other Ambulatory Visit (HOSPITAL_BASED_OUTPATIENT_CLINIC_OR_DEPARTMENT_OTHER): Payer: Self-pay

## 2023-05-31 MED ORDER — LISDEXAMFETAMINE DIMESYLATE 30 MG PO CAPS
30.0000 mg | ORAL_CAPSULE | Freq: Every morning | ORAL | 0 refills | Status: DC
  Filled 2023-05-31: qty 30, 30d supply, fill #0

## 2023-06-26 ENCOUNTER — Other Ambulatory Visit: Payer: Self-pay

## 2023-06-26 ENCOUNTER — Other Ambulatory Visit (HOSPITAL_BASED_OUTPATIENT_CLINIC_OR_DEPARTMENT_OTHER): Payer: Self-pay

## 2023-06-26 MED ORDER — METOPROLOL SUCCINATE ER 25 MG PO TB24
25.0000 mg | ORAL_TABLET | Freq: Every day | ORAL | 1 refills | Status: AC
  Filled 2023-06-26 – 2023-09-01 (×2): qty 90, 90d supply, fill #0
  Filled 2024-06-05: qty 90, 90d supply, fill #1

## 2023-06-26 MED ORDER — ESCITALOPRAM OXALATE 10 MG PO TABS
10.0000 mg | ORAL_TABLET | Freq: Every day | ORAL | 1 refills | Status: DC
  Filled 2023-06-26: qty 90, 90d supply, fill #0
  Filled 2023-10-03: qty 90, 90d supply, fill #1

## 2023-06-26 MED ORDER — OMEPRAZOLE 20 MG PO CPDR
20.0000 mg | DELAYED_RELEASE_CAPSULE | Freq: Every day | ORAL | 1 refills | Status: AC
  Filled 2023-06-26: qty 90, 90d supply, fill #0
  Filled 2023-09-01 – 2023-09-02 (×2): qty 90, 90d supply, fill #1

## 2023-06-26 MED ORDER — TRAZODONE HCL 150 MG PO TABS
150.0000 mg | ORAL_TABLET | Freq: Every day | ORAL | 1 refills | Status: AC
  Filled 2023-06-26 (×3): qty 90, 90d supply, fill #0
  Filled 2023-10-16: qty 90, 90d supply, fill #1

## 2023-06-29 ENCOUNTER — Other Ambulatory Visit: Payer: Self-pay

## 2023-06-29 ENCOUNTER — Other Ambulatory Visit (HOSPITAL_BASED_OUTPATIENT_CLINIC_OR_DEPARTMENT_OTHER): Payer: Self-pay

## 2023-06-29 MED ORDER — LISDEXAMFETAMINE DIMESYLATE 30 MG PO CAPS
30.0000 mg | ORAL_CAPSULE | Freq: Every morning | ORAL | 0 refills | Status: DC
  Filled 2023-06-29: qty 30, 30d supply, fill #0

## 2023-08-04 ENCOUNTER — Other Ambulatory Visit (HOSPITAL_BASED_OUTPATIENT_CLINIC_OR_DEPARTMENT_OTHER): Payer: Self-pay

## 2023-08-04 MED ORDER — LISDEXAMFETAMINE DIMESYLATE 30 MG PO CAPS
30.0000 mg | ORAL_CAPSULE | Freq: Every morning | ORAL | 0 refills | Status: DC
  Filled 2023-08-04: qty 30, 30d supply, fill #0

## 2023-08-05 ENCOUNTER — Other Ambulatory Visit (HOSPITAL_BASED_OUTPATIENT_CLINIC_OR_DEPARTMENT_OTHER): Payer: Self-pay

## 2023-09-01 ENCOUNTER — Other Ambulatory Visit (HOSPITAL_BASED_OUTPATIENT_CLINIC_OR_DEPARTMENT_OTHER): Payer: Self-pay

## 2023-09-02 ENCOUNTER — Other Ambulatory Visit (HOSPITAL_BASED_OUTPATIENT_CLINIC_OR_DEPARTMENT_OTHER): Payer: Self-pay

## 2023-09-02 MED ORDER — LISDEXAMFETAMINE DIMESYLATE 30 MG PO CAPS
30.0000 mg | ORAL_CAPSULE | Freq: Every morning | ORAL | 0 refills | Status: DC
  Filled 2023-09-02 (×3): qty 30, 30d supply, fill #0

## 2023-10-03 ENCOUNTER — Other Ambulatory Visit (HOSPITAL_BASED_OUTPATIENT_CLINIC_OR_DEPARTMENT_OTHER): Payer: Self-pay

## 2023-10-06 ENCOUNTER — Other Ambulatory Visit (HOSPITAL_BASED_OUTPATIENT_CLINIC_OR_DEPARTMENT_OTHER): Payer: Self-pay

## 2023-10-06 MED ORDER — LISDEXAMFETAMINE DIMESYLATE 30 MG PO CAPS
30.0000 mg | ORAL_CAPSULE | Freq: Every morning | ORAL | 0 refills | Status: DC
  Filled 2023-10-06: qty 30, 30d supply, fill #0

## 2023-11-07 ENCOUNTER — Other Ambulatory Visit (HOSPITAL_BASED_OUTPATIENT_CLINIC_OR_DEPARTMENT_OTHER): Payer: Self-pay

## 2023-11-07 MED ORDER — LISDEXAMFETAMINE DIMESYLATE 30 MG PO CAPS
30.0000 mg | ORAL_CAPSULE | Freq: Every morning | ORAL | 0 refills | Status: DC
  Filled 2023-11-07: qty 30, 30d supply, fill #0

## 2023-11-27 ENCOUNTER — Other Ambulatory Visit (HOSPITAL_BASED_OUTPATIENT_CLINIC_OR_DEPARTMENT_OTHER): Payer: Self-pay

## 2023-11-27 MED ORDER — METOPROLOL SUCCINATE ER 25 MG PO TB24
25.0000 mg | ORAL_TABLET | Freq: Every day | ORAL | 0 refills | Status: AC
  Filled 2023-11-27: qty 90, 90d supply, fill #0

## 2023-11-27 MED ORDER — ESCITALOPRAM OXALATE 10 MG PO TABS: 10.0000 mg | ORAL_TABLET | Freq: Every day | ORAL | 0 refills | Status: AC

## 2023-11-27 MED ORDER — OMEPRAZOLE 20 MG PO CPDR
20.0000 mg | DELAYED_RELEASE_CAPSULE | Freq: Every day | ORAL | 0 refills | Status: AC
  Filled 2023-11-27: qty 90, 90d supply, fill #0

## 2023-11-27 MED ORDER — TRAZODONE HCL 150 MG PO TABS: 150.0000 mg | ORAL_TABLET | Freq: Every evening | ORAL | 0 refills | Status: AC

## 2023-12-08 ENCOUNTER — Other Ambulatory Visit (HOSPITAL_BASED_OUTPATIENT_CLINIC_OR_DEPARTMENT_OTHER): Payer: Self-pay

## 2023-12-08 MED ORDER — LISDEXAMFETAMINE DIMESYLATE 30 MG PO CAPS
30.0000 mg | ORAL_CAPSULE | Freq: Every morning | ORAL | 0 refills | Status: DC
  Filled 2023-12-08: qty 30, 30d supply, fill #0

## 2024-01-05 ENCOUNTER — Other Ambulatory Visit (HOSPITAL_BASED_OUTPATIENT_CLINIC_OR_DEPARTMENT_OTHER): Payer: Self-pay

## 2024-01-05 MED ORDER — LISDEXAMFETAMINE DIMESYLATE 30 MG PO CAPS
30.0000 mg | ORAL_CAPSULE | Freq: Every morning | ORAL | 0 refills | Status: DC
  Filled 2024-01-05 – 2024-01-08 (×2): qty 30, 30d supply, fill #0

## 2024-01-05 MED ORDER — TRAZODONE HCL 150 MG PO TABS
150.0000 mg | ORAL_TABLET | Freq: Every day | ORAL | 1 refills | Status: AC
  Filled 2024-01-05: qty 90, 90d supply, fill #0
  Filled 2024-03-11 – 2024-04-16 (×4): qty 90, 90d supply, fill #1

## 2024-01-05 MED ORDER — METOPROLOL SUCCINATE ER 25 MG PO TB24
25.0000 mg | ORAL_TABLET | Freq: Every day | ORAL | 1 refills | Status: AC
  Filled 2024-01-05 – 2024-03-11 (×2): qty 90, 90d supply, fill #0

## 2024-01-05 MED ORDER — OMEPRAZOLE 20 MG PO CPDR
20.0000 mg | DELAYED_RELEASE_CAPSULE | Freq: Every day | ORAL | 1 refills | Status: DC
  Filled 2024-01-05 – 2024-03-11 (×2): qty 90, 90d supply, fill #0
  Filled 2024-04-12 – 2024-06-05 (×2): qty 90, 90d supply, fill #1

## 2024-01-05 MED ORDER — ESCITALOPRAM OXALATE 10 MG PO TABS
10.0000 mg | ORAL_TABLET | Freq: Every day | ORAL | 1 refills | Status: AC
  Filled 2024-01-05: qty 90, 90d supply, fill #0
  Filled 2024-03-11 – 2024-04-12 (×2): qty 90, 90d supply, fill #1

## 2024-01-08 ENCOUNTER — Other Ambulatory Visit (HOSPITAL_BASED_OUTPATIENT_CLINIC_OR_DEPARTMENT_OTHER): Payer: Self-pay

## 2024-02-08 ENCOUNTER — Other Ambulatory Visit (HOSPITAL_BASED_OUTPATIENT_CLINIC_OR_DEPARTMENT_OTHER): Payer: Self-pay

## 2024-02-08 MED ORDER — LISDEXAMFETAMINE DIMESYLATE 30 MG PO CAPS
30.0000 mg | ORAL_CAPSULE | Freq: Every morning | ORAL | 0 refills | Status: DC
  Filled 2024-02-08: qty 30, 30d supply, fill #0

## 2024-03-11 ENCOUNTER — Other Ambulatory Visit (HOSPITAL_BASED_OUTPATIENT_CLINIC_OR_DEPARTMENT_OTHER): Payer: Self-pay

## 2024-03-11 ENCOUNTER — Other Ambulatory Visit: Payer: Self-pay

## 2024-03-12 ENCOUNTER — Other Ambulatory Visit (HOSPITAL_BASED_OUTPATIENT_CLINIC_OR_DEPARTMENT_OTHER): Payer: Self-pay

## 2024-03-12 MED ORDER — LISDEXAMFETAMINE DIMESYLATE 30 MG PO CAPS
30.0000 mg | ORAL_CAPSULE | Freq: Every morning | ORAL | 0 refills | Status: DC
  Filled 2024-03-12: qty 30, 30d supply, fill #0

## 2024-04-11 ENCOUNTER — Other Ambulatory Visit (HOSPITAL_BASED_OUTPATIENT_CLINIC_OR_DEPARTMENT_OTHER): Payer: Self-pay

## 2024-04-11 ENCOUNTER — Other Ambulatory Visit: Payer: Self-pay

## 2024-04-11 MED ORDER — LISDEXAMFETAMINE DIMESYLATE 30 MG PO CAPS
ORAL_CAPSULE | ORAL | 0 refills | Status: DC
  Filled 2024-04-11: qty 30, 30d supply, fill #0

## 2024-04-12 ENCOUNTER — Other Ambulatory Visit: Payer: Self-pay

## 2024-04-12 ENCOUNTER — Other Ambulatory Visit (HOSPITAL_BASED_OUTPATIENT_CLINIC_OR_DEPARTMENT_OTHER): Payer: Self-pay

## 2024-04-12 ENCOUNTER — Encounter: Payer: Self-pay | Admitting: Pharmacist

## 2024-04-16 ENCOUNTER — Other Ambulatory Visit (HOSPITAL_BASED_OUTPATIENT_CLINIC_OR_DEPARTMENT_OTHER): Payer: Self-pay

## 2024-04-16 ENCOUNTER — Other Ambulatory Visit (HOSPITAL_COMMUNITY): Payer: Self-pay

## 2024-04-17 ENCOUNTER — Other Ambulatory Visit: Payer: Self-pay

## 2024-04-19 ENCOUNTER — Other Ambulatory Visit (HOSPITAL_COMMUNITY): Payer: Self-pay

## 2024-05-13 ENCOUNTER — Other Ambulatory Visit (HOSPITAL_BASED_OUTPATIENT_CLINIC_OR_DEPARTMENT_OTHER): Payer: Self-pay

## 2024-05-13 MED ORDER — LISDEXAMFETAMINE DIMESYLATE 30 MG PO CAPS
30.0000 mg | ORAL_CAPSULE | Freq: Every morning | ORAL | 0 refills | Status: DC
  Filled 2024-05-13: qty 30, 30d supply, fill #0

## 2024-05-14 ENCOUNTER — Other Ambulatory Visit (HOSPITAL_BASED_OUTPATIENT_CLINIC_OR_DEPARTMENT_OTHER): Payer: Self-pay

## 2024-05-14 MED ORDER — LISDEXAMFETAMINE DIMESYLATE 30 MG PO CAPS
30.0000 mg | ORAL_CAPSULE | Freq: Every morning | ORAL | 0 refills | Status: DC
  Filled 2024-05-14 – 2024-06-14 (×3): qty 30, 30d supply, fill #0

## 2024-05-16 ENCOUNTER — Other Ambulatory Visit (HOSPITAL_BASED_OUTPATIENT_CLINIC_OR_DEPARTMENT_OTHER): Payer: Self-pay

## 2024-06-05 ENCOUNTER — Other Ambulatory Visit: Payer: Self-pay

## 2024-06-05 ENCOUNTER — Other Ambulatory Visit (HOSPITAL_BASED_OUTPATIENT_CLINIC_OR_DEPARTMENT_OTHER): Payer: Self-pay

## 2024-06-15 ENCOUNTER — Other Ambulatory Visit (HOSPITAL_BASED_OUTPATIENT_CLINIC_OR_DEPARTMENT_OTHER): Payer: Self-pay

## 2024-07-09 ENCOUNTER — Other Ambulatory Visit (HOSPITAL_BASED_OUTPATIENT_CLINIC_OR_DEPARTMENT_OTHER): Payer: Self-pay

## 2024-07-09 ENCOUNTER — Other Ambulatory Visit: Payer: Self-pay

## 2024-07-09 MED ORDER — OMEPRAZOLE 20 MG PO CPDR
20.0000 mg | DELAYED_RELEASE_CAPSULE | Freq: Every day | ORAL | 1 refills | Status: AC
  Filled 2024-07-09 – 2024-07-11 (×2): qty 90, 90d supply, fill #0

## 2024-07-09 MED ORDER — TRAZODONE HCL 150 MG PO TABS
150.0000 mg | ORAL_TABLET | Freq: Every day | ORAL | 1 refills | Status: AC
  Filled 2024-07-11: qty 90, 90d supply, fill #0

## 2024-07-09 MED ORDER — METOPROLOL SUCCINATE ER 25 MG PO TB24
25.0000 mg | ORAL_TABLET | Freq: Every day | ORAL | 1 refills | Status: AC
  Filled 2024-07-09 – 2024-07-11 (×2): qty 90, 90d supply, fill #0

## 2024-07-09 MED ORDER — ESCITALOPRAM OXALATE 10 MG PO TABS
10.0000 mg | ORAL_TABLET | Freq: Every day | ORAL | 1 refills | Status: AC
  Filled 2024-07-09: qty 90, 90d supply, fill #0

## 2024-07-09 MED ORDER — LISDEXAMFETAMINE DIMESYLATE 30 MG PO CAPS
30.0000 mg | ORAL_CAPSULE | Freq: Every morning | ORAL | 0 refills | Status: AC
  Filled 2024-07-09 – 2024-07-11 (×2): qty 30, 30d supply, fill #0

## 2024-07-12 ENCOUNTER — Other Ambulatory Visit (HOSPITAL_BASED_OUTPATIENT_CLINIC_OR_DEPARTMENT_OTHER): Payer: Self-pay

## 2024-07-12 ENCOUNTER — Other Ambulatory Visit: Payer: Self-pay

## 2024-07-25 ENCOUNTER — Other Ambulatory Visit: Payer: Self-pay

## 2024-07-25 ENCOUNTER — Other Ambulatory Visit (HOSPITAL_BASED_OUTPATIENT_CLINIC_OR_DEPARTMENT_OTHER): Payer: Self-pay

## 2024-07-25 MED ORDER — DICYCLOMINE HCL 10 MG PO CAPS
10.0000 mg | ORAL_CAPSULE | Freq: Three times a day (TID) | ORAL | 5 refills | Status: AC
  Filled 2024-07-25: qty 120, 30d supply, fill #0
